# Patient Record
Sex: Female | Born: 1955 | Race: White | Hispanic: No | Marital: Married | Smoking: Former smoker
Health system: Southern US, Community
[De-identification: ages and names within clinical notes are randomized; demographics above are authoritative.]

## PROBLEM LIST (undated history)

## (undated) DIAGNOSIS — I319 Disease of pericardium, unspecified: Secondary | ICD-10-CM

## (undated) DIAGNOSIS — N828 Other female genital tract fistulae: Secondary | ICD-10-CM

## (undated) DIAGNOSIS — R112 Nausea with vomiting, unspecified: Secondary | ICD-10-CM

## (undated) DIAGNOSIS — K50113 Crohn's disease of large intestine with fistula: Secondary | ICD-10-CM

## (undated) DIAGNOSIS — K909 Intestinal malabsorption, unspecified: Secondary | ICD-10-CM

## (undated) DIAGNOSIS — Z9889 Other specified postprocedural states: Secondary | ICD-10-CM

## (undated) DIAGNOSIS — I739 Peripheral vascular disease, unspecified: Secondary | ICD-10-CM

## (undated) DIAGNOSIS — D509 Iron deficiency anemia, unspecified: Secondary | ICD-10-CM

## (undated) DIAGNOSIS — D649 Anemia, unspecified: Secondary | ICD-10-CM

## (undated) DIAGNOSIS — D510 Vitamin B12 deficiency anemia due to intrinsic factor deficiency: Secondary | ICD-10-CM

## (undated) DIAGNOSIS — Z5189 Encounter for other specified aftercare: Secondary | ICD-10-CM

## (undated) DIAGNOSIS — K5090 Crohn's disease, unspecified, without complications: Secondary | ICD-10-CM

## (undated) HISTORY — PX: ABDOMINAL HYSTERECTOMY: SHX81

## (undated) HISTORY — DX: Encounter for other specified aftercare: Z51.89

## (undated) HISTORY — PX: INCISION AND DRAINAGE PERIRECTAL ABSCESS: SHX1804

## (undated) HISTORY — DX: Iron deficiency anemia, unspecified: D50.9

## (undated) HISTORY — DX: Crohn's disease of large intestine with fistula: K50.113

## (undated) HISTORY — DX: Vitamin B12 deficiency anemia due to intrinsic factor deficiency: D51.0

## (undated) HISTORY — PX: BOWEL RESECTION: SHX1257

## (undated) HISTORY — DX: Intestinal malabsorption, unspecified: K90.9

## (undated) HISTORY — PX: APPENDECTOMY: SHX54

## (undated) HISTORY — PX: BREAST SURGERY: SHX581

---

## 1978-08-03 DIAGNOSIS — I739 Peripheral vascular disease, unspecified: Secondary | ICD-10-CM

## 1978-08-03 HISTORY — DX: Peripheral vascular disease, unspecified: I73.9

## 1998-11-01 ENCOUNTER — Other Ambulatory Visit: Admission: RE | Admit: 1998-11-01 | Discharge: 1998-11-01 | Payer: Self-pay | Admitting: Obstetrics and Gynecology

## 2000-01-16 ENCOUNTER — Ambulatory Visit (HOSPITAL_COMMUNITY): Admission: RE | Admit: 2000-01-16 | Discharge: 2000-01-16 | Payer: Self-pay | Admitting: Surgery

## 2000-04-20 ENCOUNTER — Other Ambulatory Visit: Admission: RE | Admit: 2000-04-20 | Discharge: 2000-04-20 | Payer: Self-pay | Admitting: Obstetrics and Gynecology

## 2001-05-02 ENCOUNTER — Other Ambulatory Visit: Admission: RE | Admit: 2001-05-02 | Discharge: 2001-05-02 | Payer: Self-pay | Admitting: Obstetrics and Gynecology

## 2002-05-08 ENCOUNTER — Other Ambulatory Visit: Admission: RE | Admit: 2002-05-08 | Discharge: 2002-05-08 | Payer: Self-pay | Admitting: Obstetrics and Gynecology

## 2004-01-15 ENCOUNTER — Other Ambulatory Visit: Admission: RE | Admit: 2004-01-15 | Discharge: 2004-01-15 | Payer: Self-pay | Admitting: Obstetrics and Gynecology

## 2004-11-08 ENCOUNTER — Inpatient Hospital Stay (HOSPITAL_COMMUNITY): Admission: EM | Admit: 2004-11-08 | Discharge: 2004-11-13 | Payer: Self-pay | Admitting: Emergency Medicine

## 2004-11-08 IMAGING — CR DG ABDOMEN ACUTE W/ 1V CHEST
3 series · 3 of 3 positions shown · non-contrast
Comparison: none

CLINICAL DATA: Abdominal pain.  Crohn?s disease.  
ABDOMINAL SERIES INCLUDING PA CHEST:
No comparison. 
No evidence of infiltrate or congestive heart failure.   Mediastinum and cardiac silhouette within normal limits.   bnormal bowel gas pattern with gas filled dilated loops of small bowel spanning over 4.2 cm.   No free intraperitoneal air.  If underlying inflammatory process related to Crohn?s disease is of a clinical concern, and further delineation is clinically desired, CT imaging may be considered.   There is a 1.4 cm radiopaque structure overlying the right lower quadrant abdomen.   A appendicolith cannot be excluded.  This also can be further delineated with CT imaging.

[w chest pa]
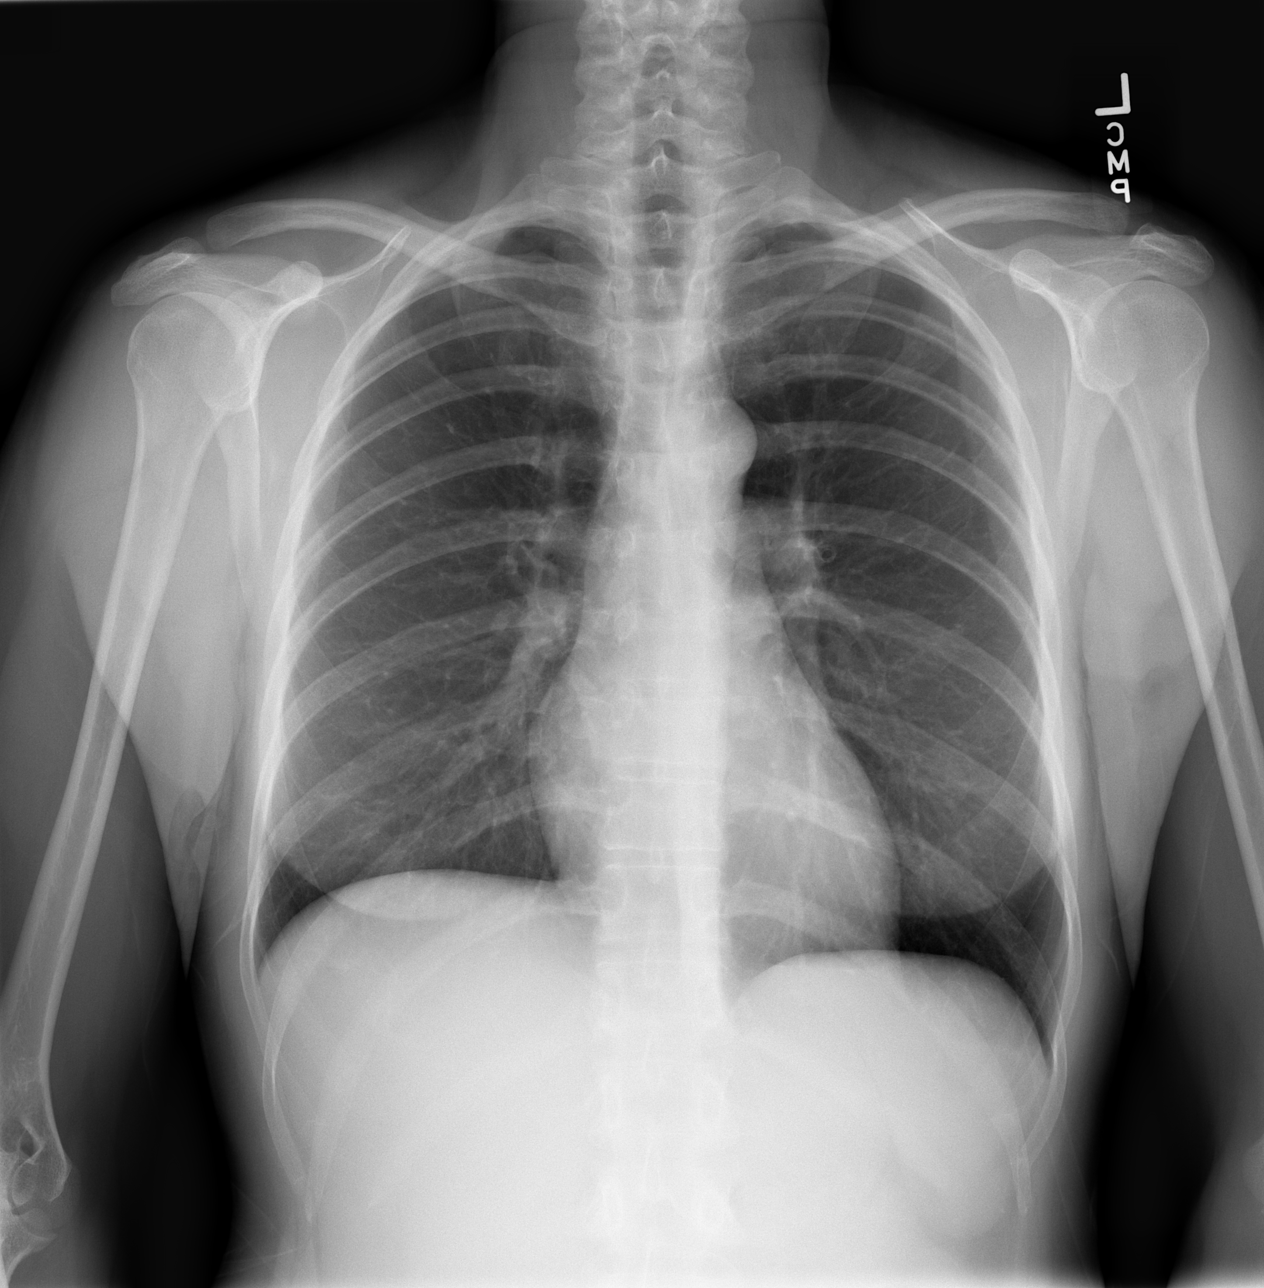

[w abdomen upright *]
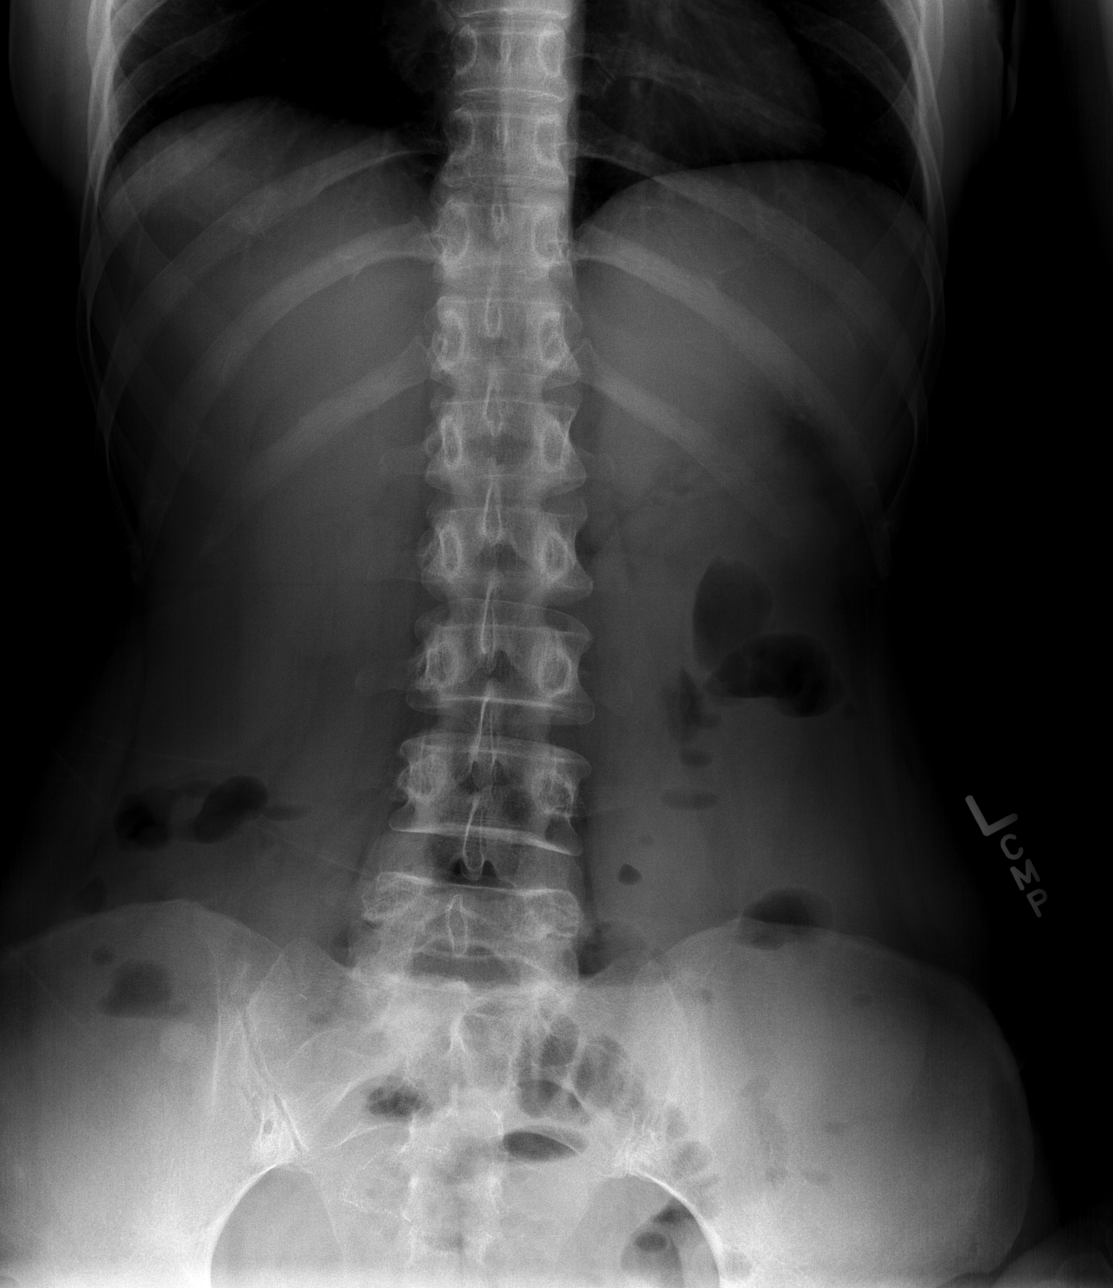

[t abdomen supine]
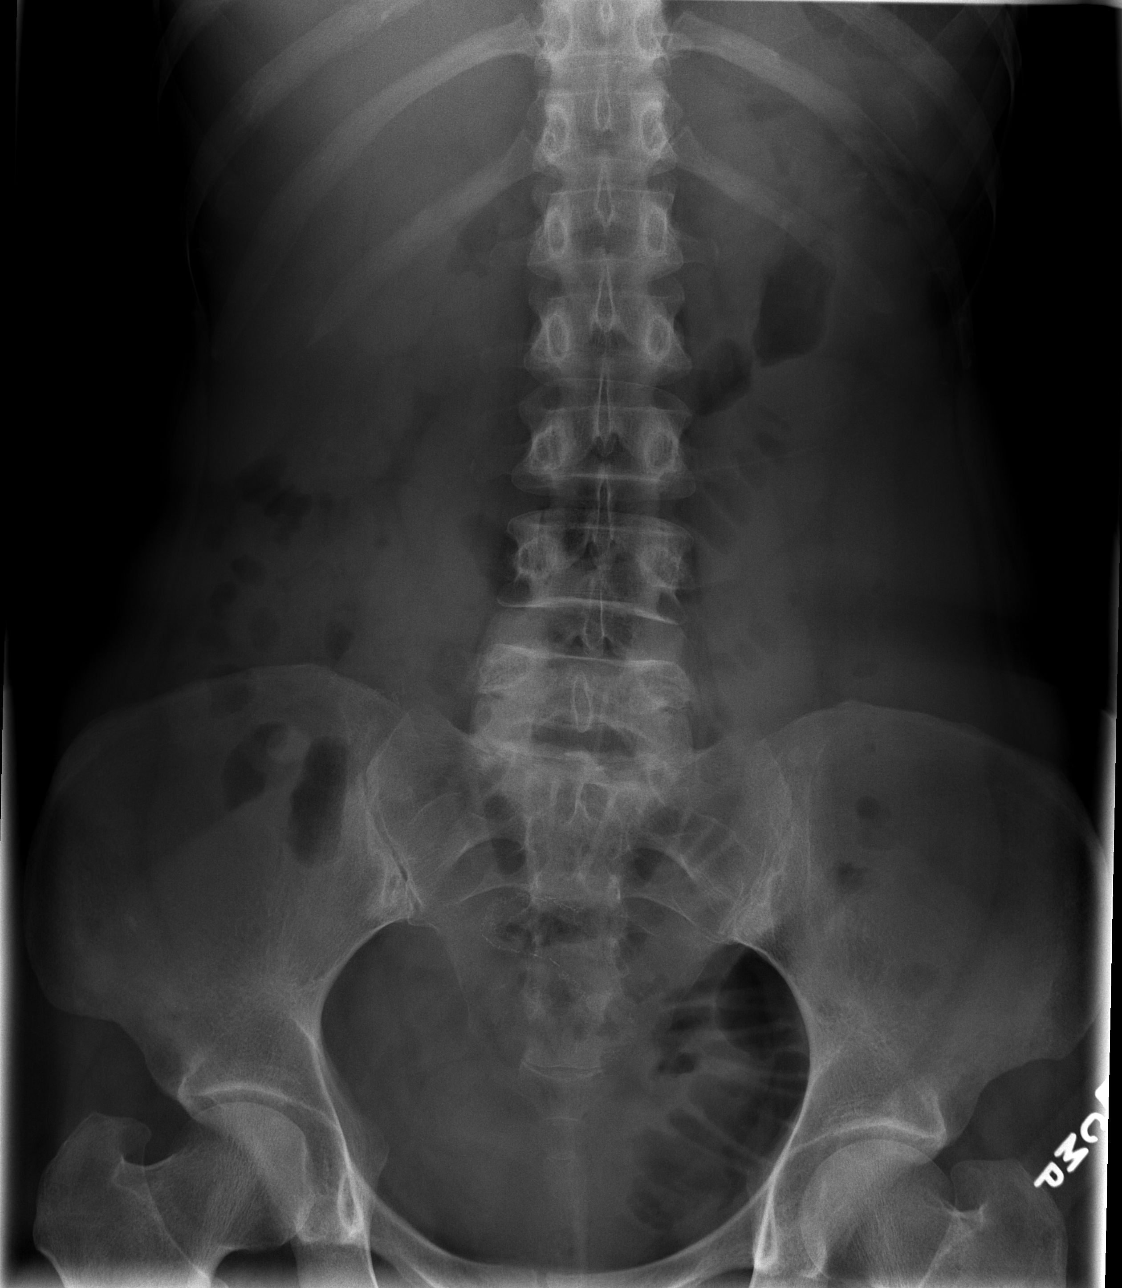

[3 of 3 positions shown; findings below may reference images not displayed]

IMPRESSION: Abnormal bowel gas pattern with dilated small bowel loops and possible appendicolith.  Please see above.

## 2004-11-12 ENCOUNTER — Encounter (INDEPENDENT_AMBULATORY_CARE_PROVIDER_SITE_OTHER): Payer: Self-pay | Admitting: Specialist

## 2004-11-13 ENCOUNTER — Encounter (INDEPENDENT_AMBULATORY_CARE_PROVIDER_SITE_OTHER): Payer: Self-pay | Admitting: Specialist

## 2005-01-28 ENCOUNTER — Other Ambulatory Visit: Admission: RE | Admit: 2005-01-28 | Discharge: 2005-01-28 | Payer: Self-pay | Admitting: Obstetrics and Gynecology

## 2005-04-20 ENCOUNTER — Encounter (INDEPENDENT_AMBULATORY_CARE_PROVIDER_SITE_OTHER): Payer: Self-pay | Admitting: Specialist

## 2005-04-20 ENCOUNTER — Inpatient Hospital Stay (HOSPITAL_COMMUNITY): Admission: RE | Admit: 2005-04-20 | Discharge: 2005-04-22 | Payer: Self-pay | Admitting: Obstetrics and Gynecology

## 2005-04-29 ENCOUNTER — Inpatient Hospital Stay (HOSPITAL_COMMUNITY): Admission: EM | Admit: 2005-04-29 | Discharge: 2005-05-06 | Payer: Self-pay | Admitting: Gastroenterology

## 2005-04-29 ENCOUNTER — Encounter (INDEPENDENT_AMBULATORY_CARE_PROVIDER_SITE_OTHER): Payer: Self-pay | Admitting: Specialist

## 2005-04-29 ENCOUNTER — Encounter: Payer: Self-pay | Admitting: Emergency Medicine

## 2005-05-27 ENCOUNTER — Encounter (HOSPITAL_COMMUNITY): Admission: RE | Admit: 2005-05-27 | Discharge: 2005-06-02 | Payer: Self-pay | Admitting: Gastroenterology

## 2005-10-20 ENCOUNTER — Observation Stay (HOSPITAL_COMMUNITY): Admission: AD | Admit: 2005-10-20 | Discharge: 2005-10-21 | Payer: Self-pay | Admitting: Gastroenterology

## 2005-11-09 ENCOUNTER — Ambulatory Visit (HOSPITAL_COMMUNITY): Admission: RE | Admit: 2005-11-09 | Discharge: 2005-11-10 | Payer: Self-pay | Admitting: Surgery

## 2005-11-27 ENCOUNTER — Encounter: Admission: RE | Admit: 2005-11-27 | Discharge: 2005-11-27 | Payer: Self-pay | Admitting: Gastroenterology

## 2006-01-14 ENCOUNTER — Encounter (HOSPITAL_COMMUNITY): Admission: RE | Admit: 2006-01-14 | Discharge: 2006-04-21 | Payer: Self-pay | Admitting: Gastroenterology

## 2007-11-08 ENCOUNTER — Encounter: Admission: RE | Admit: 2007-11-08 | Discharge: 2007-11-08 | Payer: Self-pay | Admitting: Obstetrics and Gynecology

## 2007-12-20 ENCOUNTER — Ambulatory Visit: Payer: Self-pay | Admitting: Hematology & Oncology

## 2008-01-18 LAB — CBC & DIFF AND RETIC
Basophils Absolute: 0 10*3/uL (ref 0.0–0.1)
Eosinophils Absolute: 0.2 10*3/uL (ref 0.0–0.5)
HGB: 10.1 g/dL — ABNORMAL LOW (ref 11.6–15.9)
IRF: 0.29 (ref 0.130–0.330)
NEUT#: 5.9 10*3/uL (ref 1.5–6.5)
RDW: 20 % — ABNORMAL HIGH (ref 11.3–14.5)
RETIC #: 42.1 10*3/uL (ref 19.7–115.1)
lymph#: 1.8 10*3/uL (ref 0.9–3.3)

## 2008-01-18 LAB — CHCC SMEAR

## 2008-01-20 LAB — TRANSFERRIN RECEPTOR, SOLUABLE: Transferrin Receptor, Soluble: 30.5 nmol/L

## 2008-02-07 ENCOUNTER — Ambulatory Visit: Payer: Self-pay | Admitting: Hematology & Oncology

## 2008-03-12 ENCOUNTER — Ambulatory Visit: Payer: Self-pay | Admitting: Hematology & Oncology

## 2008-05-11 ENCOUNTER — Ambulatory Visit: Payer: Self-pay | Admitting: Hematology & Oncology

## 2008-05-14 LAB — CBC WITH DIFFERENTIAL (CANCER CENTER ONLY)
BASO#: 0.1 10*3/uL (ref 0.0–0.2)
Eosinophils Absolute: 0.3 10*3/uL (ref 0.0–0.5)
HCT: 38.3 % (ref 34.8–46.6)
HGB: 12.8 g/dL (ref 11.6–15.9)
LYMPH#: 2.7 10*3/uL (ref 0.9–3.3)
MONO#: 0.3 10*3/uL (ref 0.1–0.9)
NEUT#: 7 10*3/uL — ABNORMAL HIGH (ref 1.5–6.5)
NEUT%: 67.3 % (ref 39.6–80.0)
RBC: 4.55 10*6/uL (ref 3.70–5.32)

## 2008-05-14 LAB — MORPHOLOGY - CHCC SATELLITE: PLT EST ~~LOC~~: ADEQUATE

## 2008-05-16 LAB — FERRITIN: Ferritin: 133 ng/mL (ref 10–291)

## 2008-05-16 LAB — TRANSFERRIN RECEPTOR, SOLUABLE: Transferrin Receptor, Soluble: 13.8 nmol/L

## 2008-05-16 LAB — RETICULOCYTES (CHCC)
ABS Retic: 31.9 10*3/uL (ref 19.0–186.0)
RBC.: 4.55 MIL/uL (ref 3.87–5.11)

## 2008-09-13 ENCOUNTER — Ambulatory Visit: Payer: Self-pay | Admitting: Hematology & Oncology

## 2008-09-14 LAB — CBC WITH DIFFERENTIAL (CANCER CENTER ONLY)
BASO%: 0.1 % (ref 0.0–2.0)
EOS%: 2.4 % (ref 0.0–7.0)
HCT: 34.2 % — ABNORMAL LOW (ref 34.8–46.6)
HGB: 11 g/dL — ABNORMAL LOW (ref 11.6–15.9)
LYMPH#: 1.7 10*3/uL (ref 0.9–3.3)
MCHC: 32.1 g/dL (ref 32.0–36.0)
MONO#: 0.8 10*3/uL (ref 0.1–0.9)
NEUT#: 5.3 10*3/uL (ref 1.5–6.5)
RDW: 17 % — ABNORMAL HIGH (ref 10.5–14.6)
WBC: 8 10*3/uL (ref 3.9–10.0)

## 2008-09-14 LAB — CHCC SATELLITE - SMEAR

## 2008-09-14 LAB — RETICULOCYTES (CHCC)
ABS Retic: 74.2 10*3/uL (ref 19.0–186.0)
RBC.: 4.12 MIL/uL (ref 3.87–5.11)
Retic Ct Pct: 1.8 % (ref 0.4–3.1)

## 2008-09-14 LAB — FERRITIN: Ferritin: 248 ng/mL (ref 10–291)

## 2008-12-20 ENCOUNTER — Ambulatory Visit: Payer: Self-pay | Admitting: Hematology & Oncology

## 2008-12-21 LAB — CBC WITH DIFFERENTIAL (CANCER CENTER ONLY)
BASO%: 0.5 % (ref 0.0–2.0)
EOS%: 4 % (ref 0.0–7.0)
HCT: 34 % — ABNORMAL LOW (ref 34.8–46.6)
LYMPH#: 2 10*3/uL (ref 0.9–3.3)
LYMPH%: 28.6 % (ref 14.0–48.0)
MCHC: 31.8 g/dL — ABNORMAL LOW (ref 32.0–36.0)
MONO#: 0.4 10*3/uL (ref 0.1–0.9)
NEUT%: 61.3 % (ref 39.6–80.0)
Platelets: 385 10*3/uL (ref 145–400)
RDW: 14.9 % — ABNORMAL HIGH (ref 10.5–14.6)
WBC: 7 10*3/uL (ref 3.9–10.0)

## 2008-12-21 LAB — CHCC SATELLITE - SMEAR

## 2008-12-24 LAB — VITAMIN D 25 HYDROXY (VIT D DEFICIENCY, FRACTURES): Vit D, 25-Hydroxy: 25 ng/mL — ABNORMAL LOW (ref 30–89)

## 2008-12-24 LAB — COMPREHENSIVE METABOLIC PANEL WITH GFR
ALT: 10 U/L (ref 0–35)
AST: 12 U/L (ref 0–37)
Albumin: 3.5 g/dL (ref 3.5–5.2)
Alkaline Phosphatase: 99 U/L (ref 39–117)
BUN: 8 mg/dL (ref 6–23)
CO2: 25 meq/L (ref 19–32)
Calcium: 9.3 mg/dL (ref 8.4–10.5)
Chloride: 107 meq/L (ref 96–112)
Creatinine, Ser: 0.73 mg/dL (ref 0.40–1.20)
Glucose, Bld: 67 mg/dL — ABNORMAL LOW (ref 70–99)
Potassium: 4.4 meq/L (ref 3.5–5.3)
Sodium: 142 meq/L (ref 135–145)
Total Bilirubin: 0.3 mg/dL (ref 0.3–1.2)
Total Protein: 7.2 g/dL (ref 6.0–8.3)

## 2008-12-24 LAB — RETICULOCYTES (CHCC)
ABS Retic: 45 K/uL (ref 19.0–186.0)
RBC.: 4.09 MIL/uL (ref 3.87–5.11)
Retic Ct Pct: 1.1 % (ref 0.4–3.1)

## 2008-12-24 LAB — FERRITIN: Ferritin: 122 ng/mL (ref 10–291)

## 2008-12-24 LAB — TRANSFERRIN RECEPTOR, SOLUABLE: Transferrin Receptor, Soluble: 17.4 nmol/L

## 2009-03-21 ENCOUNTER — Ambulatory Visit: Payer: Self-pay | Admitting: Hematology & Oncology

## 2009-03-22 LAB — CBC WITH DIFFERENTIAL (CANCER CENTER ONLY)
BASO%: 0.6 % (ref 0.0–2.0)
EOS%: 3.1 % (ref 0.0–7.0)
HCT: 34.4 % — ABNORMAL LOW (ref 34.8–46.6)
LYMPH%: 27.1 % (ref 14.0–48.0)
MCH: 27.8 pg (ref 26.0–34.0)
MCHC: 33.4 g/dL (ref 32.0–36.0)
MCV: 83 fL (ref 81–101)
MONO%: 5.2 % (ref 0.0–13.0)
NEUT%: 64 % (ref 39.6–80.0)
Platelets: 297 10*3/uL (ref 145–400)
RDW: 13.8 % (ref 10.5–14.6)
WBC: 7.6 10*3/uL (ref 3.9–10.0)

## 2009-03-22 LAB — CHCC SATELLITE - SMEAR

## 2009-03-24 LAB — COMPREHENSIVE METABOLIC PANEL
ALT: 12 U/L (ref 0–35)
Alkaline Phosphatase: 98 U/L (ref 39–117)
Creatinine, Ser: 0.71 mg/dL (ref 0.40–1.20)
Sodium: 140 mEq/L (ref 135–145)
Total Bilirubin: 0.2 mg/dL — ABNORMAL LOW (ref 0.3–1.2)
Total Protein: 6.9 g/dL (ref 6.0–8.3)

## 2009-05-16 ENCOUNTER — Ambulatory Visit: Payer: Self-pay | Admitting: Hematology & Oncology

## 2009-05-17 LAB — CBC WITH DIFFERENTIAL (CANCER CENTER ONLY)
BASO#: 0 10*3/uL (ref 0.0–0.2)
Eosinophils Absolute: 0.2 10*3/uL (ref 0.0–0.5)
HCT: 36.8 % (ref 34.8–46.6)
LYMPH%: 31 % (ref 14.0–48.0)
MCH: 28.4 pg (ref 26.0–34.0)
MCV: 86 fL (ref 81–101)
MONO#: 0.3 10*3/uL (ref 0.1–0.9)
MONO%: 4.9 % (ref 0.0–13.0)
NEUT%: 60.3 % (ref 39.6–80.0)
Platelets: 290 10*3/uL (ref 145–400)
RBC: 4.25 10*6/uL (ref 3.70–5.32)

## 2009-07-11 ENCOUNTER — Ambulatory Visit: Payer: Self-pay | Admitting: Hematology & Oncology

## 2009-07-12 LAB — CHCC SATELLITE - SMEAR

## 2009-07-12 LAB — CBC WITH DIFFERENTIAL (CANCER CENTER ONLY)
BASO#: 0.1 10*3/uL (ref 0.0–0.2)
Eosinophils Absolute: 0.2 10*3/uL (ref 0.0–0.5)
HGB: 12.3 g/dL (ref 11.6–15.9)
MCH: 28.9 pg (ref 26.0–34.0)
MCV: 85 fL (ref 81–101)
MONO#: 0.5 10*3/uL (ref 0.1–0.9)
MONO%: 5.6 % (ref 0.0–13.0)
NEUT#: 5.1 10*3/uL (ref 1.5–6.5)
RBC: 4.26 10*6/uL (ref 3.70–5.32)
WBC: 8 10*3/uL (ref 3.9–10.0)

## 2009-07-13 LAB — VITAMIN D 25 HYDROXY (VIT D DEFICIENCY, FRACTURES): Vit D, 25-Hydroxy: 20 ng/mL — ABNORMAL LOW (ref 30–89)

## 2009-07-13 LAB — FERRITIN: Ferritin: 143 ng/mL (ref 10–291)

## 2009-10-17 ENCOUNTER — Ambulatory Visit: Payer: Self-pay | Admitting: Hematology & Oncology

## 2009-10-18 LAB — COMPREHENSIVE METABOLIC PANEL
ALT: 16 U/L (ref 0–35)
AST: 12 U/L (ref 0–37)
Albumin: 3.5 g/dL (ref 3.5–5.2)
Alkaline Phosphatase: 96 U/L (ref 39–117)
BUN: 13 mg/dL (ref 6–23)
CO2: 26 mEq/L (ref 19–32)
Calcium: 8.4 mg/dL (ref 8.4–10.5)
Chloride: 106 mEq/L (ref 96–112)
Creatinine, Ser: 0.75 mg/dL (ref 0.40–1.20)
Glucose, Bld: 74 mg/dL (ref 70–99)
Potassium: 4.4 mEq/L (ref 3.5–5.3)
Sodium: 140 mEq/L (ref 135–145)
Total Bilirubin: 0.3 mg/dL (ref 0.3–1.2)
Total Protein: 7.3 g/dL (ref 6.0–8.3)

## 2009-10-18 LAB — CBC WITH DIFFERENTIAL (CANCER CENTER ONLY)
BASO#: 0 10*3/uL (ref 0.0–0.2)
BASO%: 0.4 % (ref 0.0–2.0)
EOS%: 2.9 % (ref 0.0–7.0)
Eosinophils Absolute: 0.2 10*3/uL (ref 0.0–0.5)
HCT: 34.6 % — ABNORMAL LOW (ref 34.8–46.6)
HGB: 11.3 g/dL — ABNORMAL LOW (ref 11.6–15.9)
LYMPH#: 2 10*3/uL (ref 0.9–3.3)
LYMPH%: 28.3 % (ref 14.0–48.0)
MCH: 28.4 pg (ref 26.0–34.0)
MCHC: 32.7 g/dL (ref 32.0–36.0)
MCV: 87 fL (ref 81–101)
MONO#: 0.4 10*3/uL (ref 0.1–0.9)
MONO%: 5.2 % (ref 0.0–13.0)
NEUT#: 4.5 10*3/uL (ref 1.5–6.5)
NEUT%: 63.2 % (ref 39.6–80.0)
Platelets: 289 10*3/uL (ref 145–400)
RBC: 3.98 10*6/uL (ref 3.70–5.32)
RDW: 12.4 % (ref 10.5–14.6)
WBC: 7.1 10*3/uL (ref 3.9–10.0)

## 2009-10-18 LAB — VITAMIN D 25 HYDROXY (VIT D DEFICIENCY, FRACTURES): Vit D, 25-Hydroxy: 19 ng/mL — ABNORMAL LOW (ref 30–89)

## 2009-10-18 LAB — FERRITIN: Ferritin: 116 ng/mL (ref 10–291)

## 2010-01-16 ENCOUNTER — Ambulatory Visit: Payer: Self-pay | Admitting: Hematology & Oncology

## 2010-01-17 LAB — CBC WITH DIFFERENTIAL (CANCER CENTER ONLY)
BASO#: 0.1 10*3/uL (ref 0.0–0.2)
BASO%: 1 % (ref 0.0–2.0)
EOS%: 4.4 % (ref 0.0–7.0)
Eosinophils Absolute: 0.3 10*3/uL (ref 0.0–0.5)
HCT: 35.8 % (ref 34.8–46.6)
HGB: 11.8 g/dL (ref 11.6–15.9)
LYMPH#: 2.2 10*3/uL (ref 0.9–3.3)
LYMPH%: 29.6 % (ref 14.0–48.0)
MCH: 28.3 pg (ref 26.0–34.0)
MCHC: 32.9 g/dL (ref 32.0–36.0)
MCV: 86 fL (ref 81–101)
MONO#: 0.3 10*3/uL (ref 0.1–0.9)
MONO%: 3.8 % (ref 0.0–13.0)
NEUT#: 4.5 10*3/uL (ref 1.5–6.5)
NEUT%: 61.2 % (ref 39.6–80.0)
Platelets: 259 10*3/uL (ref 145–400)
RBC: 4.15 10*6/uL (ref 3.70–5.32)
RDW: 12.5 % (ref 10.5–14.6)
WBC: 7.4 10*3/uL (ref 3.9–10.0)

## 2010-01-17 LAB — CHCC SATELLITE - SMEAR

## 2010-01-21 LAB — IRON AND TIBC
%SAT: 13 % — ABNORMAL LOW (ref 20–55)
Iron: 40 ug/dL — ABNORMAL LOW (ref 42–145)
TIBC: 316 ug/dL (ref 250–470)
UIBC: 276 ug/dL

## 2010-01-21 LAB — FERRITIN: Ferritin: 98 ng/mL (ref 10–291)

## 2010-01-21 LAB — RETICULOCYTES (CHCC)
ABS Retic: 40.3 10*3/uL (ref 19.0–186.0)
RBC.: 4.03 MIL/uL (ref 3.87–5.11)
Retic Ct Pct: 1 % (ref 0.4–3.1)

## 2010-01-21 LAB — VITAMIN D 25 HYDROXY (VIT D DEFICIENCY, FRACTURES): Vit D, 25-Hydroxy: 25 ng/mL — ABNORMAL LOW (ref 30–89)

## 2010-01-21 LAB — TRANSFERRIN RECEPTOR, SOLUABLE: Transferrin Receptor, Soluble: 18.5 nmol/L

## 2010-05-07 ENCOUNTER — Ambulatory Visit: Payer: Self-pay | Admitting: Hematology & Oncology

## 2010-05-09 LAB — COMPREHENSIVE METABOLIC PANEL
ALT: 19 U/L (ref 0–35)
AST: 14 U/L (ref 0–37)
Albumin: 3.4 g/dL — ABNORMAL LOW (ref 3.5–5.2)
Alkaline Phosphatase: 92 U/L (ref 39–117)
BUN: 11 mg/dL (ref 6–23)
CO2: 23 mEq/L (ref 19–32)
Calcium: 8.8 mg/dL (ref 8.4–10.5)
Creatinine, Ser: 0.74 mg/dL (ref 0.40–1.20)
Glucose, Bld: 99 mg/dL (ref 70–99)
Potassium: 4.3 mEq/L (ref 3.5–5.3)
Sodium: 139 mEq/L (ref 135–145)
Total Bilirubin: 0.3 mg/dL (ref 0.3–1.2)
Total Protein: 6.5 g/dL (ref 6.0–8.3)

## 2010-05-09 LAB — CBC WITH DIFFERENTIAL (CANCER CENTER ONLY)
BASO#: 0 10*3/uL (ref 0.0–0.2)
BASO%: 0.5 % (ref 0.0–2.0)
EOS%: 4.1 % (ref 0.0–7.0)
Eosinophils Absolute: 0.3 10*3/uL (ref 0.0–0.5)
HCT: 32.1 % — ABNORMAL LOW (ref 34.8–46.6)
LYMPH#: 1.9 10*3/uL (ref 0.9–3.3)
LYMPH%: 29.2 % (ref 14.0–48.0)
MCH: 27.1 pg (ref 26.0–34.0)
MCHC: 32.1 g/dL (ref 32.0–36.0)
MCV: 84 fL (ref 81–101)
MONO%: 5.9 % (ref 0.0–13.0)
NEUT#: 4 10*3/uL (ref 1.5–6.5)
NEUT%: 60.3 % (ref 39.6–80.0)
Platelets: 353 10*3/uL (ref 145–400)
RBC: 3.8 10*6/uL (ref 3.70–5.32)
RDW: 12.9 % (ref 10.5–14.6)
WBC: 6.6 10*3/uL (ref 3.9–10.0)

## 2010-05-09 LAB — IRON AND TIBC
%SAT: 8 % — ABNORMAL LOW (ref 20–55)
Iron: 23 ug/dL — ABNORMAL LOW (ref 42–145)
TIBC: 298 ug/dL (ref 250–470)
UIBC: 275 ug/dL

## 2010-05-09 LAB — CHCC SATELLITE - SMEAR

## 2010-05-09 LAB — FERRITIN: Ferritin: 90 ng/mL (ref 10–291)

## 2010-05-09 LAB — VITAMIN B12: Vitamin B-12: 384 pg/mL (ref 211–911)

## 2010-05-09 LAB — VITAMIN D 25 HYDROXY (VIT D DEFICIENCY, FRACTURES): Vit D, 25-Hydroxy: 26 ng/mL — ABNORMAL LOW (ref 30–89)

## 2010-09-01 ENCOUNTER — Ambulatory Visit (HOSPITAL_BASED_OUTPATIENT_CLINIC_OR_DEPARTMENT_OTHER): Payer: 59 | Admitting: Hematology & Oncology

## 2010-09-05 ENCOUNTER — Encounter (HOSPITAL_BASED_OUTPATIENT_CLINIC_OR_DEPARTMENT_OTHER): Payer: 59 | Admitting: Hematology & Oncology

## 2010-09-05 DIAGNOSIS — K5090 Crohn's disease, unspecified, without complications: Secondary | ICD-10-CM

## 2010-09-05 DIAGNOSIS — D509 Iron deficiency anemia, unspecified: Secondary | ICD-10-CM

## 2010-09-05 DIAGNOSIS — D539 Nutritional anemia, unspecified: Secondary | ICD-10-CM

## 2010-09-05 LAB — CBC WITH DIFFERENTIAL (CANCER CENTER ONLY)
BASO%: 0.4 % (ref 0.0–2.0)
EOS%: 3.4 % (ref 0.0–7.0)
HCT: 37.8 % (ref 34.8–46.6)
LYMPH#: 2.3 10*3/uL (ref 0.9–3.3)
LYMPH%: 25.8 % (ref 14.0–48.0)
MCH: 29.2 pg (ref 26.0–34.0)
MCHC: 32.3 g/dL (ref 32.0–36.0)
MCV: 90 fL (ref 81–101)
MONO#: 0.5 10*3/uL (ref 0.1–0.9)
MONO%: 5.2 % (ref 0.0–13.0)
NEUT%: 65.2 % (ref 39.6–80.0)
Platelets: 309 10*3/uL (ref 145–400)
RBC: 4.18 10*6/uL (ref 3.70–5.32)
RDW: 12.2 % (ref 10.5–14.6)
WBC: 8.9 10*3/uL (ref 3.9–10.0)

## 2010-09-05 LAB — CHCC SATELLITE - SMEAR

## 2010-09-06 LAB — IRON AND TIBC
%SAT: 20 % (ref 20–55)
Iron: 58 ug/dL (ref 42–145)
TIBC: 294 ug/dL (ref 250–470)
UIBC: 236 ug/dL

## 2010-09-06 LAB — TRANSFERRIN RECEPTOR, SOLUABLE: Transferrin Receptor, Soluble: 9.6 nmol/L

## 2010-09-06 LAB — RETICULOCYTES (CHCC)
ABS Retic: 49.2 10*3/uL (ref 19.0–186.0)
RBC.: 4.1 MIL/uL (ref 3.87–5.11)

## 2010-09-06 LAB — FERRITIN: Ferritin: 253 ng/mL (ref 10–291)

## 2010-12-12 ENCOUNTER — Other Ambulatory Visit: Payer: Self-pay | Admitting: Hematology & Oncology

## 2010-12-12 ENCOUNTER — Encounter (HOSPITAL_BASED_OUTPATIENT_CLINIC_OR_DEPARTMENT_OTHER): Payer: 59 | Admitting: Hematology & Oncology

## 2010-12-12 DIAGNOSIS — D510 Vitamin B12 deficiency anemia due to intrinsic factor deficiency: Secondary | ICD-10-CM

## 2010-12-12 DIAGNOSIS — K5090 Crohn's disease, unspecified, without complications: Secondary | ICD-10-CM

## 2010-12-12 DIAGNOSIS — D509 Iron deficiency anemia, unspecified: Secondary | ICD-10-CM

## 2010-12-12 LAB — CBC WITH DIFFERENTIAL (CANCER CENTER ONLY)
BASO%: 0.2 % (ref 0.0–2.0)
EOS%: 3.4 % (ref 0.0–7.0)
LYMPH#: 2.3 10*3/uL (ref 0.9–3.3)
MCHC: 31.9 g/dL — ABNORMAL LOW (ref 32.0–36.0)
NEUT#: 5.5 10*3/uL (ref 1.5–6.5)
NEUT%: 63.4 % (ref 39.6–80.0)
Platelets: 273 10*3/uL (ref 145–400)
RDW: 13.6 % (ref 11.1–15.7)
WBC: 8.6 10*3/uL (ref 3.9–10.0)

## 2010-12-12 LAB — RETICULOCYTES (CHCC)
ABS Retic: 48.6 10*3/uL (ref 19.0–186.0)
Retic Ct Pct: 1.2 % (ref 0.4–3.1)

## 2010-12-12 LAB — CHCC SATELLITE - SMEAR

## 2010-12-19 NOTE — H&P (Signed)
NAME:  Denise Lawson, BOETTNER NO.:  [id number]   MEDICAL RECORD NO.:  [id number]          PATIENT TYPE:  INP   LOCATION:  3039                         FACILITY:  [facility]   PHYSICIAN:  Tory Emerald. Benson Norway, MD    DATE OF BIRTH:  03/09/1956   DATE OF ADMISSION:  10/20/2005  DATE OF DISCHARGE:  10/21/2005                                HISTORY & PHYSICAL   REASON FOR ADMISSION:  Nausea, vomiting, tooth pain, diarrhea, and  dehydration.   HISTORY OF PRESENT ILLNESS:  This is a 55 year old white female with a past  medical history of Crohn's disease with obstructions requiring surgical  revision, complaining of abdominal pain, diarrhea, and tooth pain.  The  patient's symptoms started several days ago and has been progressively  getting worse.  Initially, she called the office, and she was prescribed  budesonide, as the thought was she may be having an exacerbation of her  Crohn's disease, which is also affecting her oral cavity.  The patient  denied having any aphthous ulcers in her mouth; however, her diarrhea was  consistent with her Crohn's disease.  She denied any symptoms of obstruction  at that time.  Unfortunately, her symptoms continued to persist in regards  to the tooth pain, and she was unable to tolerate any significant p.o. and  felt dehydrated.  The diarrhea had improved with the budesonide and has  become more formed.  The pain in the abdomen became more quiescent.  Unfortunately, because of the lack of po intake, she was subsequently  admitted to the hospital for IV hydration.  The patient denies any  complaints of hematochezia, melena, or fever.   PAST MEDICAL HISTORY:  As stated above.   PAST SURGICAL HISTORY:  As stated above.   FAMILY HISTORY:  Noncontributory.   SOCIAL HISTORY:  Negative x3.   REVIEW OF SYSTEMS:  As stated above.  It is positive for headache.  Negative  for chest pain, shortness of breath, arthritis, arthralgias, rashes.   ALLERGIES:   DARVOCET.   MEDICATIONS:  1.  Budesonide.  2.  Lidocaine swish.   PHYSICAL EXAMINATION:  VITAL SIGNS:  Stable.  GENERAL:  Patient is in acute distress.  She appears to be weak.  HEENT:  Normocephalic and atraumatic.  Extraocular muscles are intact.  Pupils are equal, round and reactive to light.  Oropharynx is negative for  any abscesses.  NECK:  Supple.  No lymphadenopathy.  LUNGS:  Clear to auscultation bilaterally.  CARDIOVASCULAR:  Regular rate and rhythm.  ABDOMEN:  Flat, soft, nontender, nondistended.  EXTREMITIES:  No clubbing, cyanosis or edema.   LABORATORY VALUES:  Pending at this time.   IMPRESSION:  1.  Alveolar pain of unclear etiology.  2.  Crohn's disease.   It is apparent that the patient may be having a mild flare-up of her Crohn's  disease, as she is having more diarrhea; however, I am uncertain in regards  to the connection with her oral cavity.  Crohn's disease is a systemic  manifestation, but on this examination, I am unable to discern any aphthous  ulcers or other etiologies consistent with Crohn's disease.  She denies  having any ear infections or sinus congestion.  She does have an appointment  with a dentist on October 21, 2005 at 8:00 a.m., but I feel at this time,  because of her dehydration, she requires the IV hydration.   PLAN:  1.  Normal saline IV hydration at 150 cc/hr.  2.  Morphine 2-4 mg IV q.4-6h. p.r.n. pain.  3.  Ibuprofen 800 mg 1 p.o. t.i.d.  4.  Lidocaine swish.  5.  Keep dental appointment in the morning.  6.  Check CMET, CBC, and ESR.      Tory Emerald Benson Norway, MD  Electronically Signed     PDH/MEDQ  D:  10/21/2005  T:  10/21/2005  Job:  [id number]

## 2010-12-19 NOTE — Discharge Summary (Signed)
NAME:  Denise Lawson, Denise Lawson                ACCOUNT NO.:  [id number]   MEDICAL RECORD NO.:  [id number]          PATIENT TYPE:  INP   LOCATION:  5738                         FACILITY:  [facility]   PHYSICIAN:  Tory Emerald. Benson Norway, MD    DATE OF BIRTH:  [birth year]   DATE OF ADMISSION:  04/29/2005  DATE OF DISCHARGE:  05/06/2005                                 DISCHARGE SUMMARY   DISCHARGE DIAGNOSES:  1.  Perforated Crohn's ileal/colic stricture, status post right ileal/colic      resection with primary anastomosis by Dr. Hassell Done on April 29, 2005.  2.  Crohn's disease.  3.  Status post recent hysterectomy.  4.  History of laparotomy with small-bowel resection.   HOSPITAL COURSE:  Mr. Canton is a 55 year old female with known Crohn's  disease who has also undergone an ileocolonic anastomosis 10 years ago  secondary to a ruptured appendix.  She presented on April 29, 2005 with  abdominal pain, nausea, and vomiting.  A CT scan in the emergency room  revealed findings consistent an anastomotic stricture, and she underwent  emergent colonoscopy with balloon dilatation by Tory Emerald. Benson Norway, M.D.  After  the dilatation, there was no perforation, and general surgery was consulted.   The patient underwent a right ileal/colic resection with primary anastomosis  under the care of Matthew B. Hassell Done, M.D.  The patient tolerated the  procedure well.  She remained in the hospital until postoperative day #7  where she was felt to be ready for discharge to home.  She was kept on oral  antibiotics after her discharge. She had had some pleuritic chest pain  during her hospitalization, and a CT scan showed no pulmonary embolism.   She was discharged home in stable condition.   DISCHARGE MEDICATIONS:  1.  Vicodin as needed for pain.  2.  Augmentin twice a day for one week.   DISCHARGE INSTRUCTIONS:  She is not to drive for one week.  No lifting over  10 pounds for four weeks.   FOLLOW UP:  She has a follow-up  appointment to see Dr. Hassell Done on May 20, 2005 at 4:25 p.m.  She is to call for any fevers greater than 101.5 or any  redness or drainage of her abdominal site.      Joesphine Bare, P.A.      Tory Emerald Benson Norway, MD  Electronically Signed    LB/MEDQ  D:  07/01/2005  T:  07/02/2005  Job:  [id number]   cc:   Isabel Caprice Hassell Done, [place] [address] [place] [address]

## 2010-12-19 NOTE — Consult Note (Signed)
NAME:  Denise Lawson, Denise Lawson                ACCOUNT NO.:  [id number]   MEDICAL RECORD NO.:  [id number]          PATIENT TYPE:  INP   LOCATION:  5006                         FACILITY:  [facility]   PHYSICIAN:  Tory Emerald. Benson Norway, MD    DATE OF BIRTH:  June 21, 1956   DATE OF CONSULTATION:  11/11/2004  DATE OF DISCHARGE:                                   CONSULTATION   REASON FOR CONSULTATION:  Abnormal CT scan.   HISTORY OF PRESENT ILLNESS:  This is a 55 year old white female with a past  medical history significant for status post ileocolonic anastomosis for  chronic appendicitis that ruptured approximately 7-10 years prior and a  history of diarrhea.  The patient states that approximately 1-2 days prior  to admission, she experienced some cramping sensation of her abdomen which  is not uncommon for her to experience.  She states she went to her primary  care physician and received some Phenergan as well as another medication  which helped to alleviate her symptoms temporarily.  Unfortunately, the  symptoms returned after the medication was not effective anymore and she  subsequently presented to the emergency room  at the direction of her family  physician.   In the emergency room, a CT scan was performed after an abdominal series was  obtained revealing a high grade stricture in the distal small bowel which is  most likely an anastomotic stricture.  With these findings, the patient was  subsequently admitted to the surgical service and conservative management  was instituted.  An NG tube was placed and after three days of conservative  management, her symptoms improved.  At this time, the patient had removal of  her NG tube and she states that her cramping abdominal pain has not recurred  and she does have some liquid brown bowel movements.  Because of the  abnormality of the CT scan in addition to the high grade stricture, there  was a possible fecalith versus a foreign body that was noted just  proximal  to the stricture.  Hence, GI consultation is requested at this time to  further evaluate this abnormality with an endoscopic examination.   PAST MEDICAL AND SURGICAL HISTORY:  As stated above.   ALLERGIES:  Darvocet.   MEDICATIONS:  None.   REVIEW OF SYMPTOMS:  The patient denies any headache, blurred vision,  tinnitus.  Positive for nausea, vomiting, abdominal pain.  No complaints of  shortness of breath or chest pain.  Positive history of diarrhea.  No  arthralgias, arthritis, or myalgias.   FAMILY HISTORY:  Noncontributory.   SOCIAL HISTORY:  The patient is married and has a child.   PHYSICAL EXAMINATION:  VITAL SIGNS:  Blood pressure 103/67, heart rate 90, temperature 99.4,  respirations 20, pulse ox 96%.  GENERAL:  The patient is in no acute distress, alert and oriented.  HEENT:  Normocephalic, atraumatic, extraocular movements intact, pupils  equal, round, reactive to light.  NECK:  Supple, no lymphadenopathy.  LUNGS:  Clear to auscultation bilaterally.  HEART:  Regular rate and rhythm without murmurs, gallops, and rubs.  ABDOMEN:  Flat, mildly tympanitic, positive bowel sounds, there is some  tenderness in the periumbilical region.  No rebound.  EXTREMITIES:  No cyanosis, clubbing, and edema.   LABORATORY DATA:  On November 09, 2004, white blood cell count is 4.1,  hemoglobin 9.9, MCV 83.9, platelets 218.  Sodium 138, potassium 3.1,  chloride 110, CO2 21, glucose 130, BUN 17, creatinine 0.8, calcium 7.5.  Urinalysis positive for E. coli.  CT scan is as stated above in the history  of present illness.   IMPRESSION:  1.  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  2.  Chronic diarrhea.  3.  Anemia.   In regards to the patient's high grade stricture, it is most likely  secondary to scarring from the anastomosis.  It is not unreasonable to  perform colonoscopy to evaluate the stricture and possibly attempt  dilatation.  I have discussed  in detail with the patient in regards to the  possibility of performing a dilation carrying the risk of perforation in a  relatively unprepped colon.  She understands and acknowledges the risks and  wishes to proceed at this time.  Additionally, the patient is noted to have  chronic diarrhea.  It is uncertain why she has multiple bowel movements per  day for these many years, however, celiac disease is a consideration and at  the time of the colonoscopy, an EGD will be performed with small bowel  biopsy.  She does carry a possible history of Crohn's disease, however, no  records are available at this time for further evaluation.  But, biopsies  will be taken in the colon if there are any suspicious areas that are noted.  The patient is also noted to have an anemia and the source of anemia is  unknown at this time.  Further evaluation with iron studies will be  performed.  If it is iron deficiency anemia, this can be correlated with  possible celiac disease.   PLAN:  1.  Perform tap water enemas, 1-2 liters, 2-3 times now as tolerated by the      patient.  2.  A 1 liter tap water enema at 6:30 a.m. on November 12, 2004.  3.  On call for endoscopy on November 12, 2004.  4.  Check coag panel, iron panel, and ferritin.      PDH/MEDQ  D:  11/11/2004  T:  11/11/2004  Job:  [id number]   cc:   Isabel Caprice Hassell Done, [place] [address]  [place]  [place] [address]   Gwenyth Ober III, M.D.  [phone] [address]  [place]  [place] [address]   [facility]

## 2010-12-19 NOTE — Op Note (Signed)
NAME:  Denise Lawson, Denise Lawson                ACCOUNT NO.:  [id number]   MEDICAL RECORD NO.:  [id number]          PATIENT TYPE:  OIB   LOCATION:  [place]                         FACILITY:  [facility]   PHYSICIAN:  Joyice Faster. Cornett, M.D.DATE OF BIRTH:  July 06, 1956   DATE OF PROCEDURE:  11/09/2005  DATE OF DISCHARGE:  11/10/2005                                 OPERATIVE REPORT   PREOP DIAGNOSIS:  Perirectal abscess.   POSTOP DIAGNOSIS:  1.  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  2.  Posterior midline anal fissure.  3.  Anterior rectal fissure just above the dentate line.   PROCEDURE:  1.  Exam under anesthesia.  2.  Placement of seton for fistula in ano.  3.  Rigid proctoscopy.   SURGEON:  Erroll Luna, MD   ANESTHESIA:  LMA.   DRAINS:  None.   SPECIMENS:  None.   INDICATIONS FOR PROCEDURE:  The patient is a 55 year old female with a  longstanding history of Crohn disease.  She has had a 2-week history of  significant perianal pain which became significantly worse over the last 36  hours.  She was seen today in the urgent office by Dr. Hedy Camara and was sent to  [facility] for examination under anesthesia due to significant pain and  concern for a perirectal abscess due to her low grade fever and pain.  I had  just talked with the patient preoperatively about what we were going to do,  which included exam under anesthesia and drainage of abscess if present.  She understood, agreed to proceed.  Risks were discussed which include  fistula in ano, bleeding, infection, possibility of incontinence as well as  the need for more surgery.   DESCRIPTION OF PROCEDURE:  The patient brought to the operating room, placed  supine.  After induction of LMA anesthesia, she is placed in stirrups in the  lithotomy position.  Perianal region was prepped and draped in sterile  fashion.  Digital examination was done.  I advanced my finger to about 7 cm  and could not palpate any fluctuant mass or could not express any  pus.  Upon  examination of the anal canal.  There is a very large posterior anal fissure  and what appeared to be a fistula originating approximately 1 cm from the  anal verge in the posterior midline.  I was able to slide a probe up through  this and with help of a anoscope I was able to visualize the dentate line.  The fistula track wrapped around what appeared to be the internal anal  sphincter and came out just in the midline and the dentate region consistent  with a fistula in ano.  The sphincter was completely exposed at this point.  There was considerable inflammation this area which made the anatomy a  little difficult to discern.  The tract did not track any more superior and  I could not see any other areas of fluctuance or tracts.  Of note in the  anterior midline just above the dentate line was a rectal fissure.  I did a  examination  of her vaginal cuff region and could not palpate any abscess or  communication from this area in the anterior rectum just above the dentate  line to the vagina.  Again I did a complete digital examination of both the  vaginal cuff as well as the rectum and could not palpate any fluctuance.  A  rigid proctoscope was advanced to approximately 10 cm.  The mucosa looked  relatively good in the rectum with no signs of any other ulcerative type  lesions in the rectum or any signs of any draining areas.  I advanced slowly  using minimal insufflation for this.  At this point, given the amount of  inflammation and her history and low grade fever I felt a seton would be  best since this did traverse her sphincter apparatus and I could not be sure  exactly due to the amount of the inflammation in the posterior midline  incision if this involved the external sphincter as well.  I used a 2-0  Prolene stitch and passed it using a probe through the fistula tract quite  easily making sure to create no false passage.  I then cinched this down  around the fistula  tract.  I used two metal clips to hold the suture in  place.  I then cut off the excess string to complete the seton placement.  At this point, I did not see any other undrained collections and there was  no significant purulence when I did a digital examination and pressed.  At  this point, all instruments were counted found to be correct.  The patient  was awakened and taken to recovery in satisfactory condition after removing  her from lithotomy and removing the LMA.      Thomas A. Cornett, M.D.  Electronically Signed     TAC/MEDQ  D:  11/09/2005  T:  11/10/2005  Job:  [id number]   cc:   Tory Emerald. Benson Norway, MD  Fax: [phone]

## 2011-03-18 ENCOUNTER — Encounter (HOSPITAL_BASED_OUTPATIENT_CLINIC_OR_DEPARTMENT_OTHER): Payer: 59 | Admitting: Hematology & Oncology

## 2011-03-18 ENCOUNTER — Other Ambulatory Visit: Payer: Self-pay | Admitting: Hematology & Oncology

## 2011-03-18 DIAGNOSIS — K5090 Crohn's disease, unspecified, without complications: Secondary | ICD-10-CM

## 2011-03-18 DIAGNOSIS — D539 Nutritional anemia, unspecified: Secondary | ICD-10-CM

## 2011-03-18 DIAGNOSIS — D518 Other vitamin B12 deficiency anemias: Secondary | ICD-10-CM

## 2011-03-18 DIAGNOSIS — F329 Major depressive disorder, single episode, unspecified: Secondary | ICD-10-CM

## 2011-03-18 DIAGNOSIS — D509 Iron deficiency anemia, unspecified: Secondary | ICD-10-CM

## 2011-03-18 LAB — CBC WITH DIFFERENTIAL (CANCER CENTER ONLY)
BASO#: 0 10*3/uL (ref 0.0–0.2)
EOS%: 2.3 % (ref 0.0–7.0)
HCT: 36.3 % (ref 34.8–46.6)
HGB: 11.5 g/dL — ABNORMAL LOW (ref 11.6–15.9)
LYMPH%: 23.5 % (ref 14.0–48.0)
MCH: 29.3 pg (ref 26.0–34.0)
MCHC: 31.7 g/dL — ABNORMAL LOW (ref 32.0–36.0)
MCV: 92 fL (ref 81–101)
MONO%: 4.9 % (ref 0.0–13.0)
NEUT%: 69.1 % (ref 39.6–80.0)

## 2011-03-19 LAB — FERRITIN: Ferritin: 240 ng/mL (ref 10–291)

## 2011-03-19 LAB — RETICULOCYTES (CHCC)
RBC.: 3.94 MIL/uL (ref 3.87–5.11)
Retic Ct Pct: 1.5 % (ref 0.4–2.3)

## 2011-03-19 LAB — TSH: TSH: 1.494 u[IU]/mL (ref 0.350–4.500)

## 2011-03-19 LAB — MAGNESIUM: Magnesium: 2 mg/dL (ref 1.5–2.5)

## 2011-03-27 ENCOUNTER — Encounter (HOSPITAL_BASED_OUTPATIENT_CLINIC_OR_DEPARTMENT_OTHER): Payer: 59 | Admitting: Hematology & Oncology

## 2011-03-27 DIAGNOSIS — K5090 Crohn's disease, unspecified, without complications: Secondary | ICD-10-CM

## 2011-03-27 DIAGNOSIS — D509 Iron deficiency anemia, unspecified: Secondary | ICD-10-CM

## 2011-11-26 ENCOUNTER — Telehealth: Payer: Self-pay | Admitting: Hematology & Oncology

## 2011-11-26 NOTE — Telephone Encounter (Signed)
Pt called made 5-24 appointment, she needs fridays.

## 2011-12-25 ENCOUNTER — Other Ambulatory Visit (HOSPITAL_BASED_OUTPATIENT_CLINIC_OR_DEPARTMENT_OTHER): Payer: 59 | Admitting: Lab

## 2011-12-25 ENCOUNTER — Ambulatory Visit (HOSPITAL_BASED_OUTPATIENT_CLINIC_OR_DEPARTMENT_OTHER): Payer: 59 | Admitting: Hematology & Oncology

## 2011-12-25 VITALS — BP 106/61 | HR 75 | Temp 97.8°F | Ht 62.0 in | Wt 111.0 lb

## 2011-12-25 DIAGNOSIS — D510 Vitamin B12 deficiency anemia due to intrinsic factor deficiency: Secondary | ICD-10-CM

## 2011-12-25 DIAGNOSIS — D509 Iron deficiency anemia, unspecified: Secondary | ICD-10-CM

## 2011-12-25 DIAGNOSIS — K5090 Crohn's disease, unspecified, without complications: Secondary | ICD-10-CM

## 2011-12-25 LAB — CBC WITH DIFFERENTIAL (CANCER CENTER ONLY)
BASO%: 0.2 % (ref 0.0–2.0)
EOS%: 3.4 % (ref 0.0–7.0)
MCH: 29.4 pg (ref 26.0–34.0)
MCHC: 31.6 g/dL — ABNORMAL LOW (ref 32.0–36.0)
MONO%: 5.9 % (ref 0.0–13.0)
NEUT#: 6.2 10*3/uL (ref 1.5–6.5)
Platelets: 295 10*3/uL (ref 145–400)
RBC: 4.02 10*6/uL (ref 3.70–5.32)

## 2011-12-25 LAB — CHCC SATELLITE - SMEAR

## 2011-12-25 NOTE — Progress Notes (Signed)
CC:   Denise Lawson, M.D. Denise Emerald Benson Norway, Denise Lawson  DIAGNOSES: 1. Iron-deficiency anemia. 2. Pernicious anemia. 3. Crohn disease.  CURRENT THERAPY: 1. IV iron as indicated. 2. The patient receives vitamin B12 one milligram IM q.2 weeks     (patient does at home).  INTERIM HISTORY:  Denise Lawson comes in for followup.  She is doing okay, although she does feel quite tired.  I have not seen her since August. I am probably quite sure that she is iron-deficient again.  Last time we checked her ferritin was back in August and she had a ferritin of 240 but iron saturation of 15%.  She is not having issues with change in diarrhea.  She does watch her diet.  She has had no problems with rashes.  There has been no bleeding.  She is looking into human growth hormone as a treatment for her Crohn's. She sees Dr. Rosaleigh Ada.  I will let him determine if this is an appropriate off-label use for [facility].  PHYSICAL EXAMINATION:  This is a thin but fairly well-nourished white female in no obvious distress.  Vital signs:  Temperature of 97.8, pulse 75, respiratory rate 20, blood pressure 106/61.  Weight is 111.  Head and neck:  A normocephalic, atraumatic skull.  There are no ocular or oral lesions.  There are no palpable cervical or supraclavicular lymph nodes.  Lungs:  Clear bilaterally.  Cardiac:  Regular rate and rhythm with a normal S1 and S2.  There are no murmurs, rubs or bruits. Abdomen:  Soft with good bowel sounds.  There is no palpable abdominal mass.  There is no fluid wave.  There is no palpable hepatosplenomegaly. She has a laparotomy scar that is well-healed.  Back:  No tenderness of the spine, ribs, or hips.  Extremities:  No clubbing, cyanosis or edema. Skin:  Some macular-type rash on her left lower leg.  Neurologic:  No focal neurological deficits.  LABORATORY STUDIES:  White cell count is 9.8, hemoglobin 11.8, hematocrit 37.4, platelet count 295.  MCV is 93.  IMPRESSION:  Ms.  Lawson is a 56 year old white female with Crohn disease.  She has iron-deficiency and pernicious anemia.  She does her B12 shots herself.  Again, we will have to see what her iron studies show.  I have to believe that she is going to be iron-deficient.  She usually does very well with IV iron.  We will plan to likely give her iron in a couple of weeks or so.  I want see her back in about 3 months' time.  When we see her back, then we will recheck her iron studies and also check her B12.    ______________________________ Volanda Napoleon, M.D. PRE/MEDQ  D:  12/25/2011  T:  12/25/2011  Job:  [id number]

## 2011-12-25 NOTE — Progress Notes (Signed)
This office note has been dictated.

## 2011-12-25 NOTE — Progress Notes (Signed)
Addended by: Burney Gauze R on: 12/25/2011 05:53 PM   Modules accepted: Orders

## 2011-12-26 LAB — IRON AND TIBC: %SAT: 13 % — ABNORMAL LOW (ref 20–55)

## 2011-12-26 LAB — RETICULOCYTES (CHCC): Retic Ct Pct: 1.2 % (ref 0.4–2.3)

## 2011-12-26 LAB — VITAMIN D 25 HYDROXY (VIT D DEFICIENCY, FRACTURES): Vit D, 25-Hydroxy: 24 ng/mL — ABNORMAL LOW (ref 30–89)

## 2011-12-30 ENCOUNTER — Telehealth: Payer: Self-pay | Admitting: *Deleted

## 2011-12-30 ENCOUNTER — Other Ambulatory Visit: Payer: Self-pay | Admitting: *Deleted

## 2011-12-30 DIAGNOSIS — E611 Iron deficiency: Secondary | ICD-10-CM

## 2011-12-30 NOTE — Telephone Encounter (Signed)
Called patient to let her know that her iron levels were low per dr. Marin Olp.  Patient would like to come Friday 01/01/12 at 1215 for infusion.

## 2011-12-30 NOTE — Telephone Encounter (Signed)
Message copied by Rico Ala on Wed Dec 30, 2011 10:14 AM ------      Message from: Volanda Napoleon      Created: Tue Dec 29, 2011  2:53 PM       Call- iron is low.  Needs Fereheme 1039m x 1 dose in 1 week.  Please set up!!!  pete

## 2011-12-31 ENCOUNTER — Other Ambulatory Visit: Payer: Self-pay | Admitting: *Deleted

## 2011-12-31 MED ORDER — ERGOCALCIFEROL 1.25 MG (50000 UT) PO CAPS: 50000.0000 [IU] | ORAL_CAPSULE | ORAL | Status: DC

## 2012-01-01 ENCOUNTER — Ambulatory Visit (HOSPITAL_BASED_OUTPATIENT_CLINIC_OR_DEPARTMENT_OTHER): Payer: 59

## 2012-01-01 VITALS — BP 104/63 | HR 80 | Temp 97.5°F

## 2012-01-01 DIAGNOSIS — D509 Iron deficiency anemia, unspecified: Secondary | ICD-10-CM

## 2012-01-01 DIAGNOSIS — E611 Iron deficiency: Secondary | ICD-10-CM

## 2012-01-01 MED ORDER — SODIUM CHLORIDE 0.9 % IV SOLN
1020.0000 mg | Freq: Once | INTRAVENOUS | Status: AC
  Administered 2012-01-01: 1020 mg via INTRAVENOUS
  Filled 2012-01-01: qty 34

## 2012-01-01 MED ORDER — SODIUM CHLORIDE 0.9 % IV SOLN
Freq: Once | INTRAVENOUS | Status: AC
  Administered 2012-01-01: 13:00:00 via INTRAVENOUS

## 2012-01-01 NOTE — Patient Instructions (Signed)
Ferumoxytol injection What is this medicine? FERUMOXYTOL is an iron complex. Iron is used to make healthy red blood cells, which carry oxygen and nutrients throughout the body. This medicine is used to treat iron deficiency anemia in people with chronic kidney disease. This medicine may be used for other purposes; ask your health care provider or pharmacist if you have questions. What should I tell my health care provider before I take this medicine? They need to know if you have any of these conditions: -anemia not caused by low iron levels -high levels of iron in the blood -magnetic resonance imaging (MRI) test scheduled -an unusual or allergic reaction to iron, other medicines, foods, dyes, or preservatives -pregnant or trying to get pregnant -breast-feeding How should I use this medicine? This medicine is for infusion into a vein. It is given by a health care professional in a hospital or clinic setting. Talk to your pediatrician regarding the use of this medicine in children. Special care may be needed. Overdosage: If you think you've taken too much of this medicine contact a poison control center or emergency room at once. Overdosage: If you think you have taken too much of this medicine contact a poison control center or emergency room at once. NOTE: This medicine is only for you. Do not share this medicine with others. What if I miss a dose? It is important not to miss your dose. Call your doctor or health care professional if you are unable to keep an appointment. What may interact with this medicine? This medicine may interact with the following medications: -other iron products This list may not describe all possible interactions. Give your health care provider a list of all the medicines, herbs, non-prescription drugs, or dietary supplements you use. Also tell them if you smoke, drink alcohol, or use illegal drugs. Some items may interact with your medicine. What should I watch  for while using this medicine? Visit your doctor or healthcare professional regularly. Tell your doctor or healthcare professional if your symptoms do not start to get better or if they get worse. You may need blood work done while you are taking this medicine. You may need to follow a special diet. Talk to your doctor. Foods that contain iron include: whole grains/cereals, dried fruits, beans, or peas, leafy green vegetables, and organ meats (liver, kidney). What side effects may I notice from receiving this medicine? Side effects that you should report to your doctor or health care professional as soon as possible: -allergic reactions like skin rash, itching or hives, swelling of the face, lips, or tongue -breathing problems -changes in blood pressure -feeling faint or lightheaded, falls -fever or chills -flushing, sweating, or hot feelings -swelling of the ankles or feet Side effects that usually do not require medical attention (Report these to your doctor or health care professional if they continue or are bothersome.): -diarrhea -headache -nausea, vomiting -stomach pain This list may not describe all possible side effects. Call your doctor for medical advice about side effects. You may report side effects to FDA at 1-800-FDA-1088. Where should I keep my medicine? This drug is given in a hospital or clinic and will not be stored at home. NOTE: This sheet is a summary. It may not cover all possible information. If you have questions about this medicine, talk to your doctor, pharmacist, or health care provider.  2012, Elsevier/Gold Standard. (04/11/2008 9:48:25 PM) 

## 2012-03-24 ENCOUNTER — Telehealth: Payer: Self-pay | Admitting: Hematology & Oncology

## 2012-03-24 NOTE — Telephone Encounter (Signed)
Pt moved 8-23 to 9-6

## 2012-03-25 ENCOUNTER — Ambulatory Visit: Payer: 59 | Admitting: Hematology & Oncology

## 2012-03-25 ENCOUNTER — Other Ambulatory Visit: Payer: 59 | Admitting: Lab

## 2012-04-08 ENCOUNTER — Ambulatory Visit (HOSPITAL_BASED_OUTPATIENT_CLINIC_OR_DEPARTMENT_OTHER): Payer: 59 | Admitting: Hematology & Oncology

## 2012-04-08 ENCOUNTER — Other Ambulatory Visit (HOSPITAL_BASED_OUTPATIENT_CLINIC_OR_DEPARTMENT_OTHER): Payer: 59 | Admitting: Lab

## 2012-04-08 VITALS — BP 101/50 | HR 64 | Temp 97.8°F | Resp 18 | Ht 62.0 in | Wt 111.0 lb

## 2012-04-08 DIAGNOSIS — D509 Iron deficiency anemia, unspecified: Secondary | ICD-10-CM

## 2012-04-08 DIAGNOSIS — D500 Iron deficiency anemia secondary to blood loss (chronic): Secondary | ICD-10-CM

## 2012-04-08 DIAGNOSIS — D510 Vitamin B12 deficiency anemia due to intrinsic factor deficiency: Secondary | ICD-10-CM

## 2012-04-08 DIAGNOSIS — K5090 Crohn's disease, unspecified, without complications: Secondary | ICD-10-CM

## 2012-04-08 DIAGNOSIS — K5289 Other specified noninfective gastroenteritis and colitis: Secondary | ICD-10-CM

## 2012-04-08 LAB — CBC WITH DIFFERENTIAL (CANCER CENTER ONLY)
BASO#: 0 10*3/uL (ref 0.0–0.2)
Eosinophils Absolute: 0.3 10*3/uL (ref 0.0–0.5)
HCT: 38.5 % (ref 34.8–46.6)
HGB: 12.3 g/dL (ref 11.6–15.9)
LYMPH#: 2.1 10*3/uL (ref 0.9–3.3)
MCH: 30.6 pg (ref 26.0–34.0)
MONO%: 6.5 % (ref 0.0–13.0)
NEUT#: 5 10*3/uL (ref 1.5–6.5)
NEUT%: 63.5 % (ref 39.6–80.0)
RBC: 4.02 10*6/uL (ref 3.70–5.32)

## 2012-04-08 LAB — VITAMIN B12: Vitamin B-12: 432 pg/mL (ref 211–911)

## 2012-04-08 LAB — FERRITIN: Ferritin: 512 ng/mL — ABNORMAL HIGH (ref 10–291)

## 2012-04-08 LAB — IRON AND TIBC
TIBC: 315 ug/dL (ref 250–470)
UIBC: 243 ug/dL (ref 125–400)

## 2012-04-08 NOTE — Progress Notes (Signed)
This office note has been dictated.

## 2012-04-09 NOTE — Progress Notes (Signed)
DIAGNOSES: 1. Crohn's disease. 2. Pernicious anemia. 3. Iron deficiency anemia.  CURRENT THERAPY: 1. IV iron as indicated, last received Feraheme in May. 2. Vitamin B12 1 mg IM q.2 weeks.  INTERIM HISTORY:  Denise Lawson comes in for followup.  Unfortunately, she developed shingles down the right L4 dermatome.  This was problem about 3-4 weeks ago.  She was put on antiviral.  She still has some postherpetic neuralgia.  She really does not want to take any medication for this.  Otherwise, she is doing fairly well.  When we saw her back in May, her ferritin was 217 with iron saturation of only 13%.  She is now taking oral HGH for the Crohn's.  She said this is helping her quite a bit.  She is under a lot of stress.  Her mom has dementia.  Denise Lawson is taking care of her mom.  She has had no problems bleeding.  She has had no "flare-ups" of the Crohn's disease.  PHYSICAL EXAMINATION:  General:  This is a well-developed, well- nourished white female in no obvious distress.  Vital signs:  Show temperature of 97.8, pulse 64, respiratory rate 18, blood pressure 101/50.  Weight is 111 pounds.  Head and neck:  Show a normocephalic, atraumatic skull.  There are no ocular or oral lesions.  There are no palpable cervical or supraclavicular lymph nodes.  Lungs:  Clear bilaterally.  Cardiac:  Regular rate and rhythm with normal S1, S2. There are no murmurs, rubs or bruits.  Abdomen:  Soft with good bowel sounds.  There is no palpable abdominal mass.  There is no fluid wave. No palpable hepatosplenomegaly.  Back:  No tenderness over the spine, ribs or hips.  Extremities:  Shows no clubbing, cyanosis or edema.  LABORATORY STUDIES:  White cell count is 7.9, hemoglobin 12.3, hematocrit 38.5, platelet count 281.  MCV is 96.  IMPRESSION:  Denise Lawson is a very nice 56 year old white female with Crohn's disease.  She has iron deficiency anemia.  She responds to IV iron.  I would be surprised if  her iron were still low.  We will go ahead and plan to get her back in another 3 months or so. Typically, we need to see her before the winter to get her iron into her.    ______________________________ Volanda Napoleon, M.D. PRE/MEDQ  D:  04/08/2012  T:  04/09/2012  Job:  [id number]

## 2012-04-11 ENCOUNTER — Telehealth: Payer: Self-pay | Admitting: *Deleted

## 2012-04-11 NOTE — Telephone Encounter (Signed)
Called patient to let her know that her iron levels are ok per dr. ennever 

## 2012-04-11 NOTE — Telephone Encounter (Signed)
Message copied by Anselm Jungling on Mon Apr 11, 2012 11:22 AM ------      Message from: Josph Macho      Created: Sun Apr 10, 2012  8:09 PM       Call - iron level is ok!!  pete

## 2012-07-14 ENCOUNTER — Other Ambulatory Visit: Payer: 59 | Admitting: Lab

## 2012-07-14 ENCOUNTER — Ambulatory Visit: Payer: 59 | Admitting: Hematology & Oncology

## 2012-07-15 ENCOUNTER — Other Ambulatory Visit: Payer: 59 | Admitting: Lab

## 2012-07-15 ENCOUNTER — Ambulatory Visit: Payer: 59 | Admitting: Medical

## 2012-07-15 ENCOUNTER — Ambulatory Visit: Payer: 59 | Admitting: Hematology & Oncology

## 2012-07-20 ENCOUNTER — Telehealth: Payer: Self-pay | Admitting: Hematology & Oncology

## 2012-07-20 NOTE — Telephone Encounter (Signed)
Patient called and cx 07/25/12 apt and stated she will call back to resch

## 2012-07-22 ENCOUNTER — Ambulatory Visit: Payer: 59 | Admitting: Medical

## 2012-07-22 ENCOUNTER — Other Ambulatory Visit: Payer: 59 | Admitting: Lab

## 2012-07-25 ENCOUNTER — Ambulatory Visit: Payer: 59 | Admitting: Hematology & Oncology

## 2012-07-25 ENCOUNTER — Other Ambulatory Visit: Payer: 59 | Admitting: Lab

## 2012-11-01 DIAGNOSIS — K5080 Crohn's disease of both small and large intestine without complications: Secondary | ICD-10-CM | POA: Insufficient documentation

## 2012-11-10 ENCOUNTER — Telehealth: Payer: Self-pay | Admitting: Hematology & Oncology

## 2012-11-10 NOTE — Telephone Encounter (Signed)
Pt made 4-14 appointment fells like her iron is low

## 2012-11-14 ENCOUNTER — Ambulatory Visit (HOSPITAL_BASED_OUTPATIENT_CLINIC_OR_DEPARTMENT_OTHER): Payer: 59 | Admitting: Hematology & Oncology

## 2012-11-14 ENCOUNTER — Other Ambulatory Visit (HOSPITAL_BASED_OUTPATIENT_CLINIC_OR_DEPARTMENT_OTHER): Payer: 59 | Admitting: Lab

## 2012-11-14 VITALS — BP 102/54 | HR 70 | Temp 98.0°F | Resp 16 | Ht 62.0 in | Wt 109.0 lb

## 2012-11-14 DIAGNOSIS — E0590 Thyrotoxicosis, unspecified without thyrotoxic crisis or storm: Secondary | ICD-10-CM

## 2012-11-14 DIAGNOSIS — D509 Iron deficiency anemia, unspecified: Secondary | ICD-10-CM

## 2012-11-14 DIAGNOSIS — D500 Iron deficiency anemia secondary to blood loss (chronic): Secondary | ICD-10-CM

## 2012-11-14 DIAGNOSIS — K5010 Crohn's disease of large intestine without complications: Secondary | ICD-10-CM

## 2012-11-14 DIAGNOSIS — K5090 Crohn's disease, unspecified, without complications: Secondary | ICD-10-CM

## 2012-11-14 DIAGNOSIS — D510 Vitamin B12 deficiency anemia due to intrinsic factor deficiency: Secondary | ICD-10-CM

## 2012-11-14 DIAGNOSIS — E559 Vitamin D deficiency, unspecified: Secondary | ICD-10-CM

## 2012-11-14 LAB — CBC WITH DIFFERENTIAL (CANCER CENTER ONLY)
Eosinophils Absolute: 0.3 10*3/uL (ref 0.0–0.5)
MONO#: 0.4 10*3/uL (ref 0.1–0.9)
NEUT#: 5.5 10*3/uL (ref 1.5–6.5)
Platelets: 275 10*3/uL (ref 145–400)
RBC: 4.38 10*6/uL (ref 3.70–5.32)
WBC: 8.3 10*3/uL (ref 3.9–10.0)

## 2012-11-14 LAB — IRON AND TIBC
%SAT: 15 % — ABNORMAL LOW (ref 20–55)
Iron: 48 ug/dL (ref 42–145)
UIBC: 269 ug/dL (ref 125–400)

## 2012-11-14 LAB — FERRITIN: Ferritin: 409 ng/mL — ABNORMAL HIGH (ref 10–291)

## 2012-11-14 LAB — CHCC SATELLITE - SMEAR

## 2012-11-14 LAB — THYROID PANEL WITH TSH - CHCC
T3 Uptake: 30.5 % (ref 22.5–37.0)
T4, Total: 8.6 ug/dL (ref 5.0–12.5)
TSH: 1.777 u[IU]/mL (ref 0.350–4.500)

## 2012-11-14 MED ORDER — VITAMIN B-12 1000 MCG SL SUBL: 1000.0000 ug | SUBLINGUAL_TABLET | SUBLINGUAL | Status: DC

## 2012-11-14 MED ORDER — ERGOCALCIFEROL 1.25 MG (50000 UT) PO CAPS: 50000.0000 [IU] | ORAL_CAPSULE | ORAL | Status: DC

## 2012-11-14 NOTE — Progress Notes (Signed)
This office note has been dictated.

## 2012-11-15 ENCOUNTER — Telehealth: Payer: Self-pay | Admitting: Oncology

## 2012-11-15 ENCOUNTER — Other Ambulatory Visit: Payer: Self-pay | Admitting: Oncology

## 2012-11-15 ENCOUNTER — Telehealth: Payer: Self-pay | Admitting: Hematology & Oncology

## 2012-11-15 DIAGNOSIS — D509 Iron deficiency anemia, unspecified: Secondary | ICD-10-CM

## 2012-11-15 MED ORDER — SODIUM CHLORIDE 0.9 % IV SOLN: 1020.0000 mg | Freq: Once | INTRAVENOUS | Status: DC

## 2012-11-15 NOTE — Telephone Encounter (Addendum)
Message copied by Lacie Draft on Tue Nov 15, 2012  3:24 PM ------      Message from: Arlan Organ R      Created: Mon Nov 14, 2012  6:01 PM       Call - iron is low, but thyroid tests are ok.  She might need a dose of Feraheme at 1020mg  x 1. Thanks!!  Cindee Lame ------Spoke with patient regarding iron, Tresa Endo will schedule.

## 2012-11-15 NOTE — Telephone Encounter (Signed)
Patient spoke with RN and per RN to sch iron apt.  Patient sch apt for 11/18/12

## 2012-11-16 NOTE — Progress Notes (Signed)
CC:   Denise Alar, NP [facility], [place], [place] [phone]  DIAGNOSES: 1. Recurrent iron-deficiency anemia. 2. Recurrent pernicious anemia. 3. Crohn disease.  CURRENT THERAPY: 1. IV iron as needed. 2. Vitamin B12 1 mg IM q.2 weeks.  INTERIM HISTORY:  Denise Lawson comes in for followup.  We last saw her back in September.  Since then, she has been to the [facility] at [facility].  She saw a doctor there.  She was having a flare- up of her Crohn's.  He wants to try to put her on a new medication, but she is not too keen on this.  She is, I think, losing some weight.  She has thyroid issues in her family.  She wants [place] to check her thyroid levels.  I think we can do this.  She has had no bleeding.  She has had poor appetite.  She has had some nausea.  She had lab work done at [facility] a couple of weeks ago.  At that time, her ferritin was 293 with an iron saturation of 21%.  Her B12 level was 414.  She did have a low vitamin D, which is not that surprising.  Her sed rate really was not all that elevated.  It was only 44.  PHYSICAL EXAM:  General:  This is a petite white female in no obvious distress.  Vital signs:  Temperature of 98, pulse 70, respiratory rate 16, blood pressure 102/54.  Weight is 109.  Head and neck: Normocephalic, atraumatic skull.  There are no ocular or oral lesions. There are no palpable cervical or supraclavicular lymph nodes.  Lungs: Clear bilaterally.  Cardiac:  Regular rate and rhythm with a normal S1 and S2.  There are no murmurs, rubs, or bruits.  Abdomen:  Soft with good bowel sounds.  There is no palpable abdominal mass.  There is no fluid wave.  There is no palpable hepatosplenomegaly.  She has a well- healed laparotomy scar.  Extremities:  No clubbing, cyanosis, or edema. Skin:  No rashes, ecchymosis, or petechia.  LABORATORY STUDIES:  White cell count is 8.3, hemoglobin 10.1, hematocrit 41.3,  platelet count 275.  IMPRESSION:  Denise Lawson is a very nice 57 year old white female with Crohn's.  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  She has iron- deficiency and pernicious anemia.  She does not need iron from my point of view.  She does the B12 shots at home.  We will go ahead and refill the vitamin D and vitamin B12.  Will plan to get her back in another 3-4 months.    ______________________________ Volanda Napoleon, M.D. PRE/MEDQ  D:  11/14/2012  T:  11/15/2012  Job:  [id number]

## 2012-11-18 ENCOUNTER — Ambulatory Visit (HOSPITAL_BASED_OUTPATIENT_CLINIC_OR_DEPARTMENT_OTHER): Payer: 59

## 2012-11-18 VITALS — BP 105/64 | HR 76 | Temp 98.7°F | Resp 16

## 2012-11-18 DIAGNOSIS — D509 Iron deficiency anemia, unspecified: Secondary | ICD-10-CM

## 2012-11-18 MED ORDER — SODIUM CHLORIDE 0.9 % IV SOLN
1020.0000 mg | Freq: Once | INTRAVENOUS | Status: AC
  Administered 2012-11-18: 1020 mg via INTRAVENOUS
  Filled 2012-11-18: qty 34

## 2012-11-18 MED ORDER — SODIUM CHLORIDE 0.9 % IV SOLN
Freq: Once | INTRAVENOUS | Status: AC
  Administered 2012-11-18: 15:00:00 via INTRAVENOUS

## 2012-11-18 NOTE — Patient Instructions (Signed)
Ferumoxytol injection What is this medicine? FERUMOXYTOL is an iron complex. Iron is used to make healthy red blood cells, which carry oxygen and nutrients throughout the body. This medicine is used to treat iron deficiency anemia in people with chronic kidney disease. This medicine may be used for other purposes; ask your health care provider or pharmacist if you have questions. What should I tell my health care provider before I take this medicine? They need to know if you have any of these conditions: -anemia not caused by low iron levels -high levels of iron in the blood -magnetic resonance imaging (MRI) test scheduled -an unusual or allergic reaction to iron, other medicines, foods, dyes, or preservatives -pregnant or trying to get pregnant -breast-feeding How should I use this medicine? This medicine is for infusion into a vein. It is given by a health care professional in a hospital or clinic setting. Talk to your pediatrician regarding the use of this medicine in children. Special care may be needed. Overdosage: If you think you've taken too much of this medicine contact a poison control center or emergency room at once. Overdosage: If you think you have taken too much of this medicine contact a poison control center or emergency room at once. NOTE: This medicine is only for you. Do not share this medicine with others. What if I miss a dose? It is important not to miss your dose. Call your doctor or health care professional if you are unable to keep an appointment. What may interact with this medicine? This medicine may interact with the following medications: -other iron products This list may not describe all possible interactions. Give your health care provider a list of all the medicines, herbs, non-prescription drugs, or dietary supplements you use. Also tell them if you smoke, drink alcohol, or use illegal drugs. Some items may interact with your medicine. What should I watch  for while using this medicine? Visit your doctor or healthcare professional regularly. Tell your doctor or healthcare professional if your symptoms do not start to get better or if they get worse. You may need blood work done while you are taking this medicine. You may need to follow a special diet. Talk to your doctor. Foods that contain iron include: whole grains/cereals, dried fruits, beans, or peas, leafy green vegetables, and organ meats (liver, kidney). What side effects may I notice from receiving this medicine? Side effects that you should report to your doctor or health care professional as soon as possible: -allergic reactions like skin rash, itching or hives, swelling of the face, lips, or tongue -breathing problems -changes in blood pressure -feeling faint or lightheaded, falls -fever or chills -flushing, sweating, or hot feelings -swelling of the ankles or feet Side effects that usually do not require medical attention (Report these to your doctor or health care professional if they continue or are bothersome.): -diarrhea -headache -nausea, vomiting -stomach pain This list may not describe all possible side effects. Call your doctor for medical advice about side effects. You may report side effects to FDA at 1-800-FDA-1088. Where should I keep my medicine? This drug is given in a hospital or clinic and will not be stored at home. NOTE: This sheet is a summary. It may not cover all possible information. If you have questions about this medicine, talk to your doctor, pharmacist, or health care provider.  2013, Elsevier/Gold Standard. (04/11/2008 9:48:25 PM)  

## 2013-03-13 ENCOUNTER — Ambulatory Visit: Payer: 59 | Admitting: Hematology & Oncology

## 2013-03-13 ENCOUNTER — Other Ambulatory Visit: Payer: 59 | Admitting: Lab

## 2013-03-16 ENCOUNTER — Telehealth: Payer: Self-pay | Admitting: Hematology & Oncology

## 2013-03-16 NOTE — Telephone Encounter (Signed)
Pt cx 8-15 said was on vacation and would call back to reschedule

## 2013-03-17 ENCOUNTER — Other Ambulatory Visit: Payer: 59 | Admitting: Lab

## 2013-03-17 ENCOUNTER — Ambulatory Visit: Payer: 59 | Admitting: Hematology & Oncology

## 2013-03-30 DIAGNOSIS — D649 Anemia, unspecified: Secondary | ICD-10-CM | POA: Insufficient documentation

## 2013-03-30 DIAGNOSIS — K5090 Crohn's disease, unspecified, without complications: Secondary | ICD-10-CM | POA: Insufficient documentation

## 2013-04-14 ENCOUNTER — Encounter (HOSPITAL_COMMUNITY): Payer: Self-pay | Admitting: *Deleted

## 2013-04-14 ENCOUNTER — Encounter (INDEPENDENT_AMBULATORY_CARE_PROVIDER_SITE_OTHER): Payer: Self-pay | Admitting: General Surgery

## 2013-04-14 ENCOUNTER — Ambulatory Visit (INDEPENDENT_AMBULATORY_CARE_PROVIDER_SITE_OTHER): Payer: 59 | Admitting: General Surgery

## 2013-04-14 VITALS — BP 98/62 | HR 72 | Temp 98.8°F | Resp 15 | Ht 61.5 in | Wt 105.2 lb

## 2013-04-14 DIAGNOSIS — L02419 Cutaneous abscess of limb, unspecified: Secondary | ICD-10-CM

## 2013-04-14 NOTE — Progress Notes (Signed)
Patient ID: [name], female   DOB: [birth year], 56 y.o.   MRN: [id number]  Chief Complaint  Patient presents with  . Follow-up    check wound on back of right leg    HPI Denise Lawson is a 57 y.o. female.   HPI She is referred by Denise Coombs, PA, for further evaluation and treatment of a infected right leg wound. She states she noticed a wound on the back of her right calf about 4 weeks ago. She is not sure if she had an insect bite or a spider bite. She did not seek medical attention until 2 weeks ago. She states she had spontaneous drainage from the area. She was placed on doxycycline. She recently saw Denise Lawson and the area was looking worse especially over the past 3-4 days. She was given a shot of Rocephin and we were asked to see her. She denies fever or chills. She does have a history of Crohn's disease but she is not on any immunosuppressive medication.  Past Medical History  Diagnosis Date  . Blood transfusion without reported diagnosis    Crohn's disease  Past Surgical History  Procedure Laterality Date  . Abdominal hysterectomy    . Bowel resection      Family History  Problem Relation Age of Onset  . Heart disease Father     Social History History  Substance Use Topics  . Smoking status: Former Smoker    Quit date: 08/03/1978  . Smokeless tobacco: Never Used  . Alcohol Use: No    Allergies  Allergen Reactions  . Darvon [Propoxyphene Hcl]     Hives  . Iohexol      Desc: VOMITING-10 YRS AGO @ [facility]     Current Outpatient Prescriptions  Medication Sig Dispense Refill  . Cholecalciferol (VITAMIN D3) LIQD Take by mouth once a week.       . Cyanocobalamin (VITAMIN B-12) 1000 MCG SUBL Take 1 tablet (1,000 mcg total) by mouth once a week.  12 tablet  3  . doxycycline (DORYX) 100 MG DR capsule Take 100 mg by mouth 2 (two) times daily.      . ergocalciferol (VITAMIN D2) 50000 UNITS capsule Take 1 capsule (50,000 Units total) by mouth once a  week.  4 capsule  12  . MULTIPLE VITAMINS PO Take by mouth. Liquid multiple vitamin      . NON FORMULARY Take by mouth every morning. Sera-Vital HGH       No current facility-administered medications for this visit.    Review of Systems Review of Systems  Constitutional: Negative for fever and chills.  Gastrointestinal: Positive for abdominal pain.  Musculoskeletal:       Left leg swelling and redness.     Blood pressure 98/62, pulse 72, temperature 98.8 F (37.1 C), temperature source Temporal, resp. rate 15, height 5' 1.5" (1.562 m), weight 105 lb 3.2 oz (47.718 kg).  Physical Exam Physical Exam  Constitutional: She appears well-developed and well-nourished. No distress.  HENT:  Head: Normocephalic and atraumatic.  Cardiovascular: Normal rate and regular rhythm.   Pulmonary/Chest: Effort normal and breath sounds normal.  Abdominal: Soft. There is no tenderness.  Midline scar.  Musculoskeletal:  Right posterior calf demonstrates a large area of erythema and induration. There are 4 fluctuant areas around this that are tender. There is a small amount of right foot swelling    Data Reviewed Note from primary care physician's office  Assessment    Complicated right  calf abscess with surrounding cellulitis. This has failed oral antibiotic therapy. The process is progressive.     Plan    She will come to the operating room tomorrow for incision, drainage, and debridement by Dr. Excell Seltzer.  She is unable to go tonight because she has to take care of her mother who has dementia.  Dr. Excell Seltzer saw her here in the office. The procedure and risks were discussed with her. Risks include but are not limited to bleeding, infection, wound problems, need for reoperation, anesthesia.       Denise Lawson J 04/14/2013, 5:21 PM

## 2013-04-14 NOTE — Patient Instructions (Signed)
Nothing to eat or drink for 8 hours prior to your surgery.  You may end up staying in the hospital after the surgery.

## 2013-04-15 ENCOUNTER — Ambulatory Visit (HOSPITAL_COMMUNITY)
Admission: RE | Admit: 2013-04-15 | Discharge: 2013-04-15 | Disposition: A | Discharge status: Home / Self Care | Payer: 59 | Source: Ambulatory Visit | Attending: General Surgery | Admitting: General Surgery

## 2013-04-15 ENCOUNTER — Encounter (HOSPITAL_COMMUNITY)
Admission: RE | Disposition: A | Discharge status: Home / Self Care | Payer: Self-pay | Source: Ambulatory Visit | Attending: General Surgery

## 2013-04-15 ENCOUNTER — Encounter (HOSPITAL_COMMUNITY): Payer: Self-pay | Admitting: Anesthesiology

## 2013-04-15 ENCOUNTER — Ambulatory Visit (HOSPITAL_COMMUNITY): Payer: 59 | Admitting: Anesthesiology

## 2013-04-15 ENCOUNTER — Encounter (HOSPITAL_COMMUNITY): Payer: Self-pay | Admitting: *Deleted

## 2013-04-15 DIAGNOSIS — L03119 Cellulitis of unspecified part of limb: Secondary | ICD-10-CM

## 2013-04-15 DIAGNOSIS — I739 Peripheral vascular disease, unspecified: Secondary | ICD-10-CM | POA: Insufficient documentation

## 2013-04-15 DIAGNOSIS — L02419 Cutaneous abscess of limb, unspecified: Secondary | ICD-10-CM

## 2013-04-15 HISTORY — DX: Other female genital tract fistulae: N82.8

## 2013-04-15 HISTORY — DX: Crohn's disease, unspecified, without complications: K50.90

## 2013-04-15 HISTORY — DX: Nausea with vomiting, unspecified: R11.2

## 2013-04-15 HISTORY — DX: Peripheral vascular disease, unspecified: I73.9

## 2013-04-15 HISTORY — DX: Other specified postprocedural states: Z98.890

## 2013-04-15 HISTORY — DX: Anemia, unspecified: D64.9

## 2013-04-15 HISTORY — PX: INCISION AND DRAINAGE ABSCESS: SHX5864

## 2013-04-15 HISTORY — DX: Disease of pericardium, unspecified: I31.9

## 2013-04-15 LAB — CBC WITH DIFFERENTIAL/PLATELET
Basophils Absolute: 0 10*3/uL (ref 0.0–0.1)
Basophils Relative: 0 % (ref 0–1)
Lymphocytes Relative: 20 % (ref 12–46)
MCHC: 31.8 g/dL (ref 30.0–36.0)
Monocytes Absolute: 0.6 10*3/uL (ref 0.1–1.0)
Neutro Abs: 6.2 10*3/uL (ref 1.7–7.7)
Neutrophils Relative %: 69 % (ref 43–77)
Platelets: 282 10*3/uL (ref 150–400)
RDW: 13.1 % (ref 11.5–15.5)
WBC: 8.9 10*3/uL (ref 4.0–10.5)

## 2013-04-15 LAB — BASIC METABOLIC PANEL
BUN: 11 mg/dL (ref 6–23)
Calcium: 9.5 mg/dL (ref 8.4–10.5)
Chloride: 104 mEq/L (ref 96–112)
Creatinine, Ser: 0.74 mg/dL (ref 0.50–1.10)
GFR calc Af Amer: >90 mL/min (ref >90)
GFR calc non Af Amer: >90 mL/min (ref >90)

## 2013-04-15 SURGERY — INCISION AND DRAINAGE, ABSCESS
Anesthesia: General | Site: Leg Lower | Laterality: Right | Wound class: Dirty or Infected

## 2013-04-15 MED ORDER — PROMETHAZINE HCL 25 MG/ML IJ SOLN: 6.2500 mg | INTRAMUSCULAR | Status: DC | PRN

## 2013-04-15 MED ORDER — HYDROCODONE-ACETAMINOPHEN 5-325 MG PO TABS
ORAL_TABLET | ORAL | Status: AC
  Administered 2013-04-15: 2
  Filled 2013-04-15: qty 2

## 2013-04-15 MED ORDER — LACTATED RINGERS IV SOLN: INTRAVENOUS | Status: DC

## 2013-04-15 MED ORDER — HEPARIN SODIUM (PORCINE) 5000 UNIT/ML IJ SOLN
5000.0000 [IU] | Freq: Once | INTRAMUSCULAR | Status: AC
  Administered 2013-04-15: 5000 [IU] via SUBCUTANEOUS
  Filled 2013-04-15: qty 1

## 2013-04-15 MED ORDER — ONDANSETRON HCL 4 MG/2ML IJ SOLN
INTRAMUSCULAR | Status: DC | PRN
  Administered 2013-04-15: 4 mg via INTRAVENOUS

## 2013-04-15 MED ORDER — DEXAMETHASONE SODIUM PHOSPHATE 4 MG/ML IJ SOLN
INTRAMUSCULAR | Status: DC | PRN
  Administered 2013-04-15: 10 mg via INTRAVENOUS

## 2013-04-15 MED ORDER — FENTANYL CITRATE 0.05 MG/ML IJ SOLN
INTRAMUSCULAR | Status: DC | PRN
  Administered 2013-04-15: 50 ug via INTRAVENOUS
  Administered 2013-04-15: 25 ug via INTRAVENOUS

## 2013-04-15 MED ORDER — PROPOFOL 10 MG/ML IV BOLUS
INTRAVENOUS | Status: DC | PRN
  Administered 2013-04-15: 175 mg via INTRAVENOUS

## 2013-04-15 MED ORDER — 0.9 % SODIUM CHLORIDE (POUR BTL) OPTIME
TOPICAL | Status: DC | PRN
  Administered 2013-04-15: 1000 mL

## 2013-04-15 MED ORDER — HYDROCODONE-ACETAMINOPHEN 5-325 MG PO TABS: 1.0000 | ORAL_TABLET | ORAL | Status: DC | PRN

## 2013-04-15 MED ORDER — CEFAZOLIN SODIUM-DEXTROSE 2-3 GM-% IV SOLR
2.0000 g | INTRAVENOUS | Status: AC
  Administered 2013-04-15: 2 g via INTRAVENOUS

## 2013-04-15 MED ORDER — LIDOCAINE HCL (CARDIAC) 20 MG/ML IV SOLN
INTRAVENOUS | Status: DC | PRN
  Administered 2013-04-15: 100 mg via INTRAVENOUS

## 2013-04-15 MED ORDER — HYDROMORPHONE HCL PF 1 MG/ML IJ SOLN
0.2500 mg | INTRAMUSCULAR | Status: DC | PRN
  Administered 2013-04-15 (×2): 0.5 mg via INTRAVENOUS

## 2013-04-15 MED ORDER — HYDROMORPHONE HCL PF 1 MG/ML IJ SOLN
INTRAMUSCULAR | Status: AC
  Filled 2013-04-15: qty 1

## 2013-04-15 MED ORDER — MIDAZOLAM HCL 5 MG/5ML IJ SOLN
INTRAMUSCULAR | Status: DC | PRN
  Administered 2013-04-15: 2 mg via INTRAVENOUS

## 2013-04-15 MED ORDER — CEFAZOLIN SODIUM-DEXTROSE 2-3 GM-% IV SOLR
INTRAVENOUS | Status: AC
  Filled 2013-04-15: qty 50

## 2013-04-15 MED ORDER — LACTATED RINGERS IV SOLN
INTRAVENOUS | Status: DC | PRN
  Administered 2013-04-15: 10:00:00 via INTRAVENOUS

## 2013-04-15 SURGICAL SUPPLY — 32 items
BANDAGE ELASTIC 4 VELCRO ST LF (GAUZE/BANDAGES/DRESSINGS) ×2 IMPLANT
BANDAGE GAUZE ELAST BULKY 4 IN (GAUZE/BANDAGES/DRESSINGS) ×1 IMPLANT
BLADE SURG 15 STRL LF DISP TIS (BLADE) ×1 IMPLANT
BLADE SURG 15 STRL SS (BLADE) ×2
CANISTER SUCTION 2500CC (MISCELLANEOUS) ×2 IMPLANT
CLOTH BEACON ORANGE TIMEOUT ST (SAFETY) ×2 IMPLANT
COVER SURGICAL LIGHT HANDLE (MISCELLANEOUS) ×2 IMPLANT
DECANTER SPIKE VIAL GLASS SM (MISCELLANEOUS) IMPLANT
DRAPE LAPAROSCOPIC ABDOMINAL (DRAPES) IMPLANT
DRAPE LAPAROTOMY T 102X78X121 (DRAPES) ×1 IMPLANT
DRSG PAD ABDOMINAL 8X10 ST (GAUZE/BANDAGES/DRESSINGS) IMPLANT
ELECT REM PT RETURN 9FT ADLT (ELECTROSURGICAL) ×2
ELECTRODE REM PT RTRN 9FT ADLT (ELECTROSURGICAL) ×1 IMPLANT
GAUZE PACKING IODOFORM 1/4X5 (PACKING) ×1 IMPLANT
GLOVE BIO SURGEON STRL SZ7.5 (GLOVE) ×2 IMPLANT
GOWN PREVENTION PLUS LG XLONG (DISPOSABLE) ×4 IMPLANT
KIT BASIN OR (CUSTOM PROCEDURE TRAY) ×2 IMPLANT
NEEDLE HYPO 25X1 1.5 SAFETY (NEEDLE) IMPLANT
NS IRRIG 1000ML POUR BTL (IV SOLUTION) ×2 IMPLANT
PENCIL BUTTON HOLSTER BLD 10FT (ELECTRODE) ×2 IMPLANT
SPONGE GAUZE 4X4 12PLY (GAUZE/BANDAGES/DRESSINGS) ×1 IMPLANT
SPONGE LAP 18X18 X RAY DECT (DISPOSABLE) ×2 IMPLANT
SUT MNCRL AB 4-0 PS2 18 (SUTURE) IMPLANT
SUT VIC AB 3-0 SH 27 (SUTURE)
SUT VIC AB 3-0 SH 27XBRD (SUTURE) IMPLANT
SWAB COLLECTION DEVICE MRSA (MISCELLANEOUS) ×1 IMPLANT
SYR BULB 3OZ (MISCELLANEOUS) ×2 IMPLANT
SYR CONTROL 10ML LL (SYRINGE) ×1 IMPLANT
TOWEL OR 17X26 10 PK STRL BLUE (TOWEL DISPOSABLE) ×2 IMPLANT
TUBE ANAEROBIC SPECIMEN COL (MISCELLANEOUS) ×1 IMPLANT
WATER STERILE IRR 1000ML POUR (IV SOLUTION) ×2 IMPLANT
YANKAUER SUCT BULB TIP NO VENT (SUCTIONS) ×2 IMPLANT

## 2013-04-15 NOTE — Op Note (Signed)
Preoperative Diagnosis: right calf abscess  Postoprative Diagnosis: right calf abscess  Procedure: Procedure(s): INCISION AND DRAINAGE AND DEBRIDEMENT RIGHT CALF ABSCESS   Surgeon: Excell Seltzer T   Assistants: None  Anesthesia:  General endotracheal anesthesia  Indications: Patient is a 57 year old female who approximately 4 weeks ago noticed a small wound on the back of her right calf or possibly an insect or spider bite. She gradually developed increasing redness and swelling and some drainage and sought attention in 2 weeks later. At that time she was started on oral antibiotics. Followup by her primary physician showed the area to look worse and she was seen in our office yesterday. See Dr. Bertrum Sol notes for details. She was found to have several areas of fluctuance and approximately 8 or 9 cm area of erythema surrounding these. We have recommended proceeding with incision and drainage and possible debridement based on operative findings. Who discussed the nature of the procedure and the fact she'll have an open wound that require wound care.    Procedure Detail:  Patient was brought to the operating room, placed in supine position on the operating table and general endotracheal anesthesia induced. She was carefully positioned in the left lateral decubitus position the posterior right calf is exposed which was sterilely prepped and draped. Patient time out was performed the correct procedure verified. She had been given broad-spectrum IV antibiotics preoperatively. Examination of the posterior calf showed again in about a 8 or 9 cm Area of erythema and induration with several small punctate areas of skin necrosis and limited areas of underlying fluctuance beneath each of these. I initially elliptically excised the largest area of punctate skin necrosis and incised into a small pocket of purulence and what appeared to be liquefied or necrotic fat beneath this. This was about a 8 or 9  mm opening. This was drained and debrided and cultured. The wound tract a little bit underneath the skin but did not seem to connect to any large pocket of pus or necrotic tissue. I then did similar excisions of 4 other areas which appeared similar in each case there was a small area of purulence and a localized area of necrotic fat but no deep communication to any large pocket or to the other areas. These wounds were irrigated and each was packed with 1/4 inch 4 gauze. The leg was dressed with Kerlix and an Ace bandage. She is to return for office in 48 hours for dressing change.    Findings: As above  Estimated Blood Loss:  Minimal         Drains: wounds packed with 1/4 inch iodoform gauze  Blood Given: none          Specimens: Culture and sensitivity and  skin and subcutaneous tissue        Complications:  * No complications entered in OR log *         Disposition: PACU - hemodynamically stable.         Condition: stable

## 2013-04-15 NOTE — Preoperative (Signed)
Beta Blockers   Reason not to administer Beta Blockers:Not Applicable 

## 2013-04-15 NOTE — Anesthesia Postprocedure Evaluation (Signed)
Anesthesia Post Note  Patient: Denise Lawson  Procedure(s) Performed: Procedure(s) (LRB): INCISION AND DRAINAGE AND DEBRIDEMENT RIGHT CALF ABSCESS (Right)  Anesthesia type: General  Patient location: PACU  Post pain: Pain level controlled  Post assessment: Post-op Vital signs reviewed  Last Vitals:  Filed Vitals:   04/15/13 1120  BP:   Pulse: 71  Temp:   Resp: 16    Post vital signs: Reviewed  Level of consciousness: sedated  Complications: No apparent anesthesia complications

## 2013-04-15 NOTE — Anesthesia Preprocedure Evaluation (Signed)
Anesthesia Evaluation  Patient identified by MRN, date of birth, ID band Patient awake    Reviewed: Allergy & Precautions, H&P , NPO status , Patient's Chart, lab work & pertinent test results  Airway Mallampati: II TM Distance: >3 FB Neck ROM: Full    Dental  (+) Teeth Intact, Partial Lower and Dental Advisory Given   Pulmonary neg pulmonary ROS, former smoker,  breath sounds clear to auscultation  Pulmonary exam normal       Cardiovascular + Peripheral Vascular Disease negative cardio ROS  Rhythm:Regular Rate:Normal     Neuro/Psych negative neurological ROS  negative psych ROS   GI/Hepatic negative GI ROS, Neg liver ROS, Crohns disease   Endo/Other  negative endocrine ROS  Renal/GU negative Renal ROS  negative genitourinary   Musculoskeletal negative musculoskeletal ROS (+)   Abdominal   Peds  Hematology negative hematology ROS (+) Blood dyscrasia, anemia ,   Anesthesia Other Findings   Reproductive/Obstetrics                           Anesthesia Physical Anesthesia Plan  ASA: II  Anesthesia Plan: General   Post-op Pain Management:    Induction: Intravenous  Airway Management Planned: LMA  Additional Equipment:   Intra-op Plan:   Post-operative Plan: Extubation in OR  Informed Consent: I have reviewed the patients History and Physical, chart, labs and discussed the procedure including the risks, benefits and alternatives for the proposed anesthesia with the patient or authorized representative who has indicated his/her understanding and acceptance.   Dental advisory given  Plan Discussed with: CRNA  Anesthesia Plan Comments:         Anesthesia Quick Evaluation

## 2013-04-15 NOTE — Transfer of Care (Signed)
Immediate Anesthesia Transfer of Care Note  Patient: Denise Lawson  Procedure(s) Performed: Procedure(s): INCISION AND DRAINAGE AND DEBRIDEMENT RIGHT CALF ABSCESS (Right)  Patient Location: PACU  Anesthesia Type:General  Level of Consciousness: sedated  Airway & Oxygen Therapy: Patient Spontanous Breathing and Patient connected to face mask oxygen  Post-op Assessment: Report given to PACU RN and Post -op Vital signs reviewed and stable  Post vital signs: Reviewed and stable  Complications: No apparent anesthesia complications

## 2013-04-17 ENCOUNTER — Encounter (HOSPITAL_COMMUNITY): Payer: Self-pay | Admitting: General Surgery

## 2013-04-17 ENCOUNTER — Ambulatory Visit (INDEPENDENT_AMBULATORY_CARE_PROVIDER_SITE_OTHER): Payer: 59 | Admitting: General Surgery

## 2013-04-17 VITALS — BP 104/62 | HR 68 | Resp 14 | Ht 62.0 in | Wt 103.6 lb

## 2013-04-17 DIAGNOSIS — L02419 Cutaneous abscess of limb, unspecified: Secondary | ICD-10-CM

## 2013-04-17 DIAGNOSIS — L03116 Cellulitis of left lower limb: Secondary | ICD-10-CM

## 2013-04-17 NOTE — Progress Notes (Signed)
Denise Lawson is a 57 y.o. female who is status post a I&D of calf on 9/13.  She is doing better.  She denies any pain.  Objective: Filed Vitals:   04/17/13 1505  BP: 104/62  Pulse: 68  Resp: 14    General appearance: alert and cooperative  Incision: no significant drainage Mild erythema around incision site, no edema, tissue appears healthy Packing removed  Assessment: s/p  Patient Active Problem List   Diagnosis Date Noted  . Cellulitis and abscess of leg-right 04/14/2013    Plan: Return to office in 7-10 days to see Dr Excell Seltzer Complete antibiotics course Ok to return to light activity Keep elevated when lying or sitting Change dressing daily    .Rosario Adie, [facility], [place]   04/17/2013 3:17 PM

## 2013-04-17 NOTE — Patient Instructions (Signed)
Return to office in 7-10 days to see Dr Excell Seltzer Complete antibiotics course Ok to return to light activity Keep elevated when lying or sitting Change dressing daily Ok to shower.  No tub baths.

## 2013-04-19 LAB — CULTURE, ROUTINE-ABSCESS: Culture: NO GROWTH

## 2013-04-20 LAB — ANAEROBIC CULTURE

## 2013-04-28 ENCOUNTER — Encounter (INDEPENDENT_AMBULATORY_CARE_PROVIDER_SITE_OTHER): Payer: Self-pay | Admitting: General Surgery

## 2013-04-28 ENCOUNTER — Ambulatory Visit (INDEPENDENT_AMBULATORY_CARE_PROVIDER_SITE_OTHER): Payer: 59 | Admitting: General Surgery

## 2013-04-28 VITALS — BP 114/60 | HR 72 | Temp 98.7°F | Resp 14 | Ht 62.0 in | Wt 105.0 lb

## 2013-04-28 DIAGNOSIS — L02419 Cutaneous abscess of limb, unspecified: Secondary | ICD-10-CM

## 2013-04-28 NOTE — Progress Notes (Signed)
History: Patient returns following incision and drainage of subcutaneous abscess of her calf. She reports she is doing very well. She has completed her antibiotics. Cultures did not grow anything. She had been on previous antibiotic treatment.  Exam: On the posterior calf is some minimal residual erythema that is about completely gone. 3 small I&D sites just a few millimeters showed clean granulation and no tracking or drainage.  Assessment and plan: Doing well following incision and drainage debridement of Abscess. It is essentially completely healed. She will call as needed is not completely healed in the next couple of weeks.

## 2013-05-02 ENCOUNTER — Encounter (INDEPENDENT_AMBULATORY_CARE_PROVIDER_SITE_OTHER): Payer: Self-pay

## 2013-11-07 ENCOUNTER — Telehealth: Payer: Self-pay | Admitting: Hematology & Oncology

## 2013-11-07 NOTE — Telephone Encounter (Signed)
Patient called resch 03/17/13 missed apt for 11/19/13

## 2013-11-09 ENCOUNTER — Other Ambulatory Visit (HOSPITAL_BASED_OUTPATIENT_CLINIC_OR_DEPARTMENT_OTHER): Payer: 59 | Admitting: Lab

## 2013-11-09 ENCOUNTER — Encounter: Payer: Self-pay | Admitting: Hematology & Oncology

## 2013-11-09 ENCOUNTER — Ambulatory Visit (HOSPITAL_BASED_OUTPATIENT_CLINIC_OR_DEPARTMENT_OTHER): Payer: 59 | Admitting: Hematology & Oncology

## 2013-11-09 VITALS — BP 106/50 | HR 84 | Temp 98.2°F | Resp 14 | Ht 62.0 in | Wt 103.0 lb

## 2013-11-09 DIAGNOSIS — D510 Vitamin B12 deficiency anemia due to intrinsic factor deficiency: Secondary | ICD-10-CM

## 2013-11-09 DIAGNOSIS — E559 Vitamin D deficiency, unspecified: Secondary | ICD-10-CM

## 2013-11-09 DIAGNOSIS — D509 Iron deficiency anemia, unspecified: Secondary | ICD-10-CM

## 2013-11-09 DIAGNOSIS — K5010 Crohn's disease of large intestine without complications: Secondary | ICD-10-CM

## 2013-11-09 DIAGNOSIS — K9089 Other intestinal malabsorption: Secondary | ICD-10-CM

## 2013-11-09 DIAGNOSIS — D508 Other iron deficiency anemias: Secondary | ICD-10-CM

## 2013-11-09 DIAGNOSIS — K909 Intestinal malabsorption, unspecified: Secondary | ICD-10-CM

## 2013-11-09 DIAGNOSIS — K50113 Crohn's disease of large intestine with fistula: Secondary | ICD-10-CM

## 2013-11-09 LAB — CBC WITH DIFFERENTIAL (CANCER CENTER ONLY)
BASO#: 0 10*3/uL (ref 0.0–0.2)
BASO%: 0.3 % (ref 0.0–2.0)
EOS%: 1.6 % (ref 0.0–7.0)
Eosinophils Absolute: 0.2 10*3/uL (ref 0.0–0.5)
HCT: 36.2 % (ref 34.8–46.6)
HGB: 11.5 g/dL — ABNORMAL LOW (ref 11.6–15.9)
LYMPH#: 1.8 10*3/uL (ref 0.9–3.3)
LYMPH%: 18 % (ref 14.0–48.0)
MCH: 30.1 pg (ref 26.0–34.0)
MCHC: 31.8 g/dL — ABNORMAL LOW (ref 32.0–36.0)
MCV: 95 fL (ref 81–101)
MONO#: 0.7 10*3/uL (ref 0.1–0.9)
MONO%: 7.1 % (ref 0.0–13.0)
NEUT#: 7.2 10*3/uL — ABNORMAL HIGH (ref 1.5–6.5)
NEUT%: 73 % (ref 39.6–80.0)
PLATELETS: 288 10*3/uL (ref 145–400)
RBC: 3.82 10*6/uL (ref 3.70–5.32)
RDW: 13.2 % (ref 11.1–15.7)
WBC: 9.8 10*3/uL (ref 3.9–10.0)

## 2013-11-09 LAB — CHCC SATELLITE - SMEAR

## 2013-11-10 LAB — IRON AND TIBC CHCC
%SAT: 10 % — ABNORMAL LOW (ref 21–57)
Iron: 26 ug/dL — ABNORMAL LOW (ref 41–142)
TIBC: 248 ug/dL (ref 236–444)
UIBC: 222 ug/dL (ref 120–384)

## 2013-11-10 LAB — VITAMIN D 25 HYDROXY (VIT D DEFICIENCY, FRACTURES): VIT D 25 HYDROXY: 21 ng/mL — AB (ref 30–89)

## 2013-11-10 LAB — VITAMIN B12: Vitamin B-12: 320 pg/mL (ref 211–911)

## 2013-11-10 LAB — FERRITIN CHCC: Ferritin: 556 ng/ml — ABNORMAL HIGH (ref 9–269)

## 2013-11-13 ENCOUNTER — Telehealth: Payer: Self-pay | Admitting: Nurse Practitioner

## 2013-11-13 ENCOUNTER — Telehealth: Payer: Self-pay | Admitting: *Deleted

## 2013-11-13 ENCOUNTER — Other Ambulatory Visit: Payer: Self-pay | Admitting: Nurse Practitioner

## 2013-11-13 ENCOUNTER — Encounter: Payer: Self-pay | Admitting: Hematology & Oncology

## 2013-11-13 DIAGNOSIS — D510 Vitamin B12 deficiency anemia due to intrinsic factor deficiency: Secondary | ICD-10-CM

## 2013-11-13 DIAGNOSIS — D509 Iron deficiency anemia, unspecified: Secondary | ICD-10-CM

## 2013-11-13 DIAGNOSIS — K909 Intestinal malabsorption, unspecified: Secondary | ICD-10-CM | POA: Insufficient documentation

## 2013-11-13 DIAGNOSIS — E559 Vitamin D deficiency, unspecified: Secondary | ICD-10-CM

## 2013-11-13 DIAGNOSIS — K50113 Crohn's disease of large intestine with fistula: Secondary | ICD-10-CM

## 2013-11-13 HISTORY — DX: Crohn's disease of large intestine with fistula: K50.113

## 2013-11-13 HISTORY — DX: Intestinal malabsorption, unspecified: K90.9

## 2013-11-13 HISTORY — DX: Vitamin B12 deficiency anemia due to intrinsic factor deficiency: D51.0

## 2013-11-13 HISTORY — DX: Iron deficiency anemia, unspecified: D50.9

## 2013-11-13 MED ORDER — ERGOCALCIFEROL 1.25 MG (50000 UT) PO CAPS: 50000.0000 [IU] | ORAL_CAPSULE | ORAL | Status: DC

## 2013-11-13 NOTE — Telephone Encounter (Addendum)
Message copied by Mirian Capuchin on Mon Nov 13, 2013 11:15 AM ------      Message from: Josph Macho      Created: Sun Nov 12, 2013  8:39 PM       Call - iron is low.  Please set up feraheme 1020mg  x 1 dose this week.  pete ------This message given to pt's husband.  He will ask pt to call and schedule this for this week.

## 2013-11-13 NOTE — Progress Notes (Signed)
Per Dr. Myna Hidalgo her Vitamin b12 was ok however her vit D was low. Pt instructed to take 50,000 u/week and rx sent in to pharmacy of choice. Pt verbalized understanding and appreciation.

## 2013-11-13 NOTE — Progress Notes (Signed)
Hematology and Oncology Follow Up Visit  Denise Lawson [id number] 1955-09-22 58 y.o. 11/13/2013   Principle Diagnosis:  . Recurrent iron-deficiency anemia. 2. Recurrent pernicious anemia. 3. Crohn disease.  Current Therapy:   IV iron as needed. 2. Vitamin B12 1 mg IM q.2 weeks.     Interim History:  Ms.  Denise Lawson is back for followup. His blood a year since we last saw her. She has been having some issues with the Crohn's disease. Perhaps she feels tired. She feels better on is probably low. We usually have to give her iron every time we see her.  She's had no obvious bleeding. She's had some abdominal pain. She's had some nausea vomiting. Generally, her Crohn's flares up during the wintertime.  She's had no cough. There's been no fever. She had no leg swelling. There's been no headache. Medications: Current outpatient prescriptions:Cyanocobalamin (VITAMIN B 12 PO), Take by mouth every morning., Disp: , Rfl: ;  ibuprofen (ADVIL,MOTRIN) 200 MG tablet, Take 400 mg by mouth every 6 (six) hours as needed for pain. , Disp: , Rfl:   Allergies:  Allergies  Allergen Reactions  . Darvon [Propoxyphene Hcl]     Hives  . Iohexol      Desc: VOMITING-10 YRS AGO @ [facility].     Past Medical History, Surgical history, Social history, and Family History were reviewed and updated.  Review of Systems: As above  Physical Exam:  height is 5' 2"  (1.575 m) and weight is 103 lb (46.72 kg). Her oral temperature is 98.2 F (36.8 C). Her blood pressure is 106/50 and her pulse is 84. Her respiration is 14.   1 well-nourished white female. Her head exam shows no ocular or oral lesions. Sclera there is no sclera icterus. Conjunctiva slightly pale. No oral lesion. Neck is supple with no lymphadenopathy. Lungs are clear. Cardiac exam regular in rhythm with no murmurs rubs or bruits. Abdomen is soft. Has good bowel sounds. There is no fluid wave. There is no palpable hepatosplenomegaly y. She has a  well-healed laparotomy scar. There is no obvious abdominal mass. Exam no tenderness over the spine. Extremities no clubbing cyanosis or edema. Neurological exam no focal deficits.  Lab Results  Component Value Date   WBC 9.8 11/09/2013   HGB 11.5* 11/09/2013   HCT 36.2 11/09/2013   MCV 95 11/09/2013   PLT 288 11/09/2013     Chemistry      Component Value Date/Time   NA 138 04/15/2013 0825   K 3.6 04/15/2013 0825   CL 104 04/15/2013 0825   CO2 25 04/15/2013 0825   BUN 11 04/15/2013 0825   CREATININE 0.74 04/15/2013 0825      Component Value Date/Time   CALCIUM 9.5 04/15/2013 0825   ALKPHOS 92 05/09/2010 0840   AST 14 05/09/2010 0840   ALT 19 05/09/2010 0840   BILITOT 0.3 05/09/2010 0840     Her ferritin is 556 put her iron saturation is only 10%. Her B12 is 320. Her vitamin D is 21.   Impression and Plan: Ms. Denise Lawson is a 58 year old white female. His Crohn's disease. She has malabsorption. She just does not absorb iron. We will have to give her iron. She always responds to IV iron. She also will need vitamin D.  We will get her set up with iron in one week.  We also will give her a dose of vitamin B12.  I would like to see her back in another 6 weeks.  Volanda Napoleon, MD 4/13/20152:28 PM

## 2013-11-13 NOTE — Telephone Encounter (Addendum)
Message copied by Glee ArvinPICKENPACK-COUSAR, Anastasija Anfinson N on Mon Nov 13, 2013 11:05 AM ------      Message from: Josph MachoENNEVER, PETER R      Created: Sun Nov 12, 2013  8:39 PM       Call - iron is low.  Please set up feraheme 1020mg  x 1 dose this week.  pete ------Pt verbalized understanding and appreciation. She will be scheduled as requested for 11/17/13 @ 0830.

## 2013-11-17 ENCOUNTER — Ambulatory Visit (HOSPITAL_BASED_OUTPATIENT_CLINIC_OR_DEPARTMENT_OTHER): Payer: 59

## 2013-11-17 VITALS — BP 102/64 | HR 97 | Temp 98.4°F | Resp 16

## 2013-11-17 DIAGNOSIS — K909 Intestinal malabsorption, unspecified: Secondary | ICD-10-CM

## 2013-11-17 DIAGNOSIS — D509 Iron deficiency anemia, unspecified: Secondary | ICD-10-CM

## 2013-11-17 DIAGNOSIS — K5010 Crohn's disease of large intestine without complications: Secondary | ICD-10-CM

## 2013-11-17 MED ORDER — SODIUM CHLORIDE 0.9 % IV SOLN
Freq: Once | INTRAVENOUS | Status: AC
Start: 2013-11-17 — End: 2013-11-17
  Administered 2013-11-17: 09:00:00 via INTRAVENOUS

## 2013-11-17 MED ORDER — SODIUM CHLORIDE 0.9 % IV SOLN
1020.0000 mg | Freq: Once | INTRAVENOUS | Status: AC
  Administered 2013-11-17 (×2): 1020 mg via INTRAVENOUS
  Filled 2013-11-17: qty 34

## 2013-11-17 MED ORDER — IBUPROFEN 200 MG PO TABS
ORAL_TABLET | ORAL | Status: AC
  Filled 2013-11-17: qty 2

## 2013-11-17 NOTE — Patient Instructions (Signed)

## 2013-12-15 ENCOUNTER — Telehealth: Payer: Self-pay | Admitting: Hematology & Oncology

## 2013-12-15 NOTE — Telephone Encounter (Signed)
Pt moved 5-27 to 6-2 said she had another MD appointment that day

## 2013-12-27 ENCOUNTER — Ambulatory Visit: Payer: 59 | Admitting: Hematology & Oncology

## 2013-12-27 ENCOUNTER — Other Ambulatory Visit: Payer: 59 | Admitting: Lab

## 2013-12-27 ENCOUNTER — Ambulatory Visit: Payer: 59

## 2014-01-01 ENCOUNTER — Other Ambulatory Visit: Payer: Self-pay | Admitting: *Deleted

## 2014-01-01 DIAGNOSIS — D509 Iron deficiency anemia, unspecified: Secondary | ICD-10-CM

## 2014-01-02 ENCOUNTER — Ambulatory Visit (HOSPITAL_BASED_OUTPATIENT_CLINIC_OR_DEPARTMENT_OTHER): Payer: 59

## 2014-01-02 ENCOUNTER — Other Ambulatory Visit (HOSPITAL_BASED_OUTPATIENT_CLINIC_OR_DEPARTMENT_OTHER): Payer: 59 | Admitting: Lab

## 2014-01-02 ENCOUNTER — Telehealth: Payer: Self-pay | Admitting: *Deleted

## 2014-01-02 ENCOUNTER — Ambulatory Visit (HOSPITAL_BASED_OUTPATIENT_CLINIC_OR_DEPARTMENT_OTHER): Payer: 59 | Admitting: Hematology & Oncology

## 2014-01-02 ENCOUNTER — Encounter: Payer: Self-pay | Admitting: Hematology & Oncology

## 2014-01-02 VITALS — BP 106/50 | HR 75 | Temp 97.9°F | Resp 14 | Ht 62.0 in | Wt 97.0 lb

## 2014-01-02 DIAGNOSIS — K909 Intestinal malabsorption, unspecified: Secondary | ICD-10-CM

## 2014-01-02 DIAGNOSIS — K50113 Crohn's disease of large intestine with fistula: Secondary | ICD-10-CM

## 2014-01-02 DIAGNOSIS — D510 Vitamin B12 deficiency anemia due to intrinsic factor deficiency: Secondary | ICD-10-CM

## 2014-01-02 DIAGNOSIS — D509 Iron deficiency anemia, unspecified: Secondary | ICD-10-CM

## 2014-01-02 DIAGNOSIS — K5010 Crohn's disease of large intestine without complications: Secondary | ICD-10-CM

## 2014-01-02 LAB — IRON AND TIBC CHCC
%SAT: 23 % (ref 21–57)
IRON: 58 ug/dL (ref 41–142)
TIBC: 249 ug/dL (ref 236–444)
UIBC: 191 ug/dL (ref 120–384)

## 2014-01-02 LAB — CBC WITH DIFFERENTIAL (CANCER CENTER ONLY)
BASO#: 0 10*3/uL (ref 0.0–0.2)
BASO%: 0.3 % (ref 0.0–2.0)
EOS%: 2.5 % (ref 0.0–7.0)
Eosinophils Absolute: 0.3 10*3/uL (ref 0.0–0.5)
HCT: 40.9 % (ref 34.8–46.6)
HEMOGLOBIN: 12.9 g/dL (ref 11.6–15.9)
LYMPH#: 2.2 10*3/uL (ref 0.9–3.3)
LYMPH%: 20.9 % (ref 14.0–48.0)
MCH: 30.4 pg (ref 26.0–34.0)
MCHC: 31.5 g/dL — AB (ref 32.0–36.0)
MCV: 96 fL (ref 81–101)
MONO#: 0.7 10*3/uL (ref 0.1–0.9)
MONO%: 6.3 % (ref 0.0–13.0)
NEUT%: 70 % (ref 39.6–80.0)
NEUTROS ABS: 7.3 10*3/uL — AB (ref 1.5–6.5)
Platelets: 279 10*3/uL (ref 145–400)
RBC: 4.25 10*6/uL (ref 3.70–5.32)
RDW: 14 % (ref 11.1–15.7)
WBC: 10.5 10*3/uL — AB (ref 3.9–10.0)

## 2014-01-02 LAB — CHCC SATELLITE - SMEAR

## 2014-01-02 LAB — FERRITIN CHCC: Ferritin: 1161 ng/ml — ABNORMAL HIGH (ref 9–269)

## 2014-01-02 MED ORDER — CYANOCOBALAMIN 1000 MCG/ML IJ SOLN
1000.0000 ug | Freq: Once | INTRAMUSCULAR | Status: AC
  Administered 2014-01-02: 1000 ug via INTRAMUSCULAR

## 2014-01-02 NOTE — Telephone Encounter (Addendum)
Message copied by Burnett Corrente on Tue Jan 02, 2014  4:20 PM ------      Message from: Josph Macho      Created: Tue Jan 02, 2014 12:26 PM       Call - iron is fine.  pete ------Left voicemail informing pt know iron is okay

## 2014-01-02 NOTE — Progress Notes (Signed)
Hematology and Oncology Follow Up Visit  Denise Lawson [id number] 17-Feb-1956 58 y.o. 01/02/2014   Principle Diagnosis:   Recurrent iron deficiency anemia  Pernicious anemia  Crohn's disease-exacerbation  Current Therapy:    IV iron as indicated  Vitamin B12 1 mg IM every month     Interim History:  Denise Lawson is back for followup. She reports a 30 exasperated Crohn's disease. She does have abdominal pain. She's not having any out his bleeding. She does have a lot of spasms.  Plus give her IV iron back in April.  She is not on systemic B12. She takes orally. She feels very tired. When we last saw her, her B12 level was 320. I suspect that she may need a dose of vitamin B12.  She has a decreased appetite. There is some nausea but no vomiting. She's had no skin rashes. She's had no fever.  Medications: Current outpatient prescriptions:Cyanocobalamin (VITAMIN B 12 PO), Take by mouth every morning., Disp: , Rfl: ;  ergocalciferol (VITAMIN D2) 50000 UNITS capsule, Take 1 capsule (50,000 Units total) by mouth once a week., Disp: 4 capsule, Rfl: 12;  ibuprofen (ADVIL,MOTRIN) 200 MG tablet, Take 400 mg by mouth every 6 (six) hours as needed for pain. , Disp: , Rfl:  oxycodone-acetaminophen (PERCOCET) 2.5-325 MG per tablet, Take 1 tablet by mouth as needed for pain., Disp: , Rfl: ;  prednisoLONE (ORAPRED ODT) 30 MG disintegrating tablet, Take 30 mg by mouth 2 (two) times daily., Disp: , Rfl: ;  Promethazine HCl (PHENERGAN PO), Take by mouth as needed. Dose ?, Disp: , Rfl:   Allergies:  Allergies  Allergen Reactions  . Darvon [Propoxyphene Hcl]     Hives  . Iohexol      Desc: VOMITING-10 YRS AGO @ [facility].     Past Medical History, Surgical history, Social history, and Family History were reviewed and updated.  Review of Systems: As above  Physical Exam:  height is 5' 2"  (1.575 m) and weight is 97 lb (43.999 kg). Her oral temperature is 97.9 F (36.6 C). Her blood  pressure is 106/50 and her pulse is 75. Her respiration is 14.   Thin white female. Her head and exam shows no ocular or oral lesion. She is no palpable cervical or supraclavicular lymph nodes. Lungs are clear. Cardiac exam regular rate and rhythm with no murmurs rubs or bruits. Abdomen is soft. Some tenderness to palpation. She has active bowel sounds. She is no distention. She is no palpable liver or spleen. Extremities shows no clubbing cyanosis or edema. Skin exam no rashes. Neurological exam is non-focal.  Lab Results  Component Value Date   WBC 10.5* 01/02/2014   HGB 12.9 01/02/2014   HCT 40.9 01/02/2014   MCV 96 01/02/2014   PLT 279 01/02/2014     Chemistry      Component Value Date/Time   NA 138 04/15/2013 0825   K 3.6 04/15/2013 0825   CL 104 04/15/2013 0825   CO2 25 04/15/2013 0825   BUN 11 04/15/2013 0825   CREATININE 0.74 04/15/2013 0825      Component Value Date/Time   CALCIUM 9.5 04/15/2013 0825   ALKPHOS 92 05/09/2010 0840   AST 14 05/09/2010 0840   ALT 19 05/09/2010 0840   BILITOT 0.3 05/09/2010 0840         Impression and Plan: Denise Lawson is 54 sexual white female. His Crohn's disease. She is not [place] because of his.  3 we have  to go back on systemic B12. I don't think take it orally is going to help her. This might be why she feels tired.  I will try to "load" her. We'll do B12 weekly for 4 weeks. We'll then go monthly and see if that makes a difference.  I will plan to see her back myself in another 6-8 weeks.   Volanda Napoleon, MD 6/2/20159:45 AM

## 2014-01-03 ENCOUNTER — Telehealth: Payer: Self-pay | Admitting: *Deleted

## 2014-01-03 LAB — BASIC METABOLIC PANEL
BUN: 11 mg/dL (ref 6–23)
CHLORIDE: 104 meq/L (ref 96–112)
CO2: 25 meq/L (ref 19–32)
CREATININE: 0.7 mg/dL (ref 0.50–1.10)
Calcium: 9.2 mg/dL (ref 8.4–10.5)
Glucose, Bld: 97 mg/dL (ref 70–99)
Potassium: 4 mEq/L (ref 3.5–5.3)
Sodium: 140 mEq/L (ref 135–145)

## 2014-01-03 LAB — VITAMIN D 25 HYDROXY (VIT D DEFICIENCY, FRACTURES): VIT D 25 HYDROXY: 26 ng/mL — AB (ref 30–89)

## 2014-01-03 LAB — MAGNESIUM: Magnesium: 1.8 mg/dL (ref 1.5–2.5)

## 2014-01-03 LAB — VITAMIN B12: VITAMIN B 12: 271 pg/mL (ref 211–911)

## 2014-01-03 NOTE — Telephone Encounter (Addendum)
Message copied by Burnett Corrente on Wed Jan 03, 2014  8:36 AM ------      Message from: Arlan Organ R      Created: Wed Jan 03, 2014  6:55 AM       Call - magnesium is ok!!  Cindee Lame ------Informed pt that magnesium is okay.

## 2014-01-11 ENCOUNTER — Other Ambulatory Visit: Payer: Self-pay | Admitting: Nurse Practitioner

## 2014-01-11 ENCOUNTER — Other Ambulatory Visit: Payer: Self-pay | Admitting: *Deleted

## 2014-01-11 DIAGNOSIS — D509 Iron deficiency anemia, unspecified: Secondary | ICD-10-CM

## 2014-01-11 MED ORDER — CYANOCOBALAMIN 1000 MCG/ML IJ SOLN: 1000.0000 ug | INTRAMUSCULAR | Status: DC

## 2014-01-12 ENCOUNTER — Ambulatory Visit (INDEPENDENT_AMBULATORY_CARE_PROVIDER_SITE_OTHER): Payer: 59 | Admitting: Surgery

## 2014-01-12 ENCOUNTER — Ambulatory Visit: Payer: 59

## 2014-01-12 ENCOUNTER — Encounter (INDEPENDENT_AMBULATORY_CARE_PROVIDER_SITE_OTHER): Payer: Self-pay | Admitting: Surgery

## 2014-01-12 ENCOUNTER — Other Ambulatory Visit (INDEPENDENT_AMBULATORY_CARE_PROVIDER_SITE_OTHER): Payer: Self-pay | Admitting: Surgery

## 2014-01-12 VITALS — BP 100/60 | HR 73 | Temp 98.7°F | Resp 16 | Ht 62.0 in | Wt 99.8 lb

## 2014-01-12 DIAGNOSIS — L88 Pyoderma gangrenosum: Secondary | ICD-10-CM | POA: Insufficient documentation

## 2014-01-12 DIAGNOSIS — L02419 Cutaneous abscess of limb, unspecified: Secondary | ICD-10-CM

## 2014-01-12 DIAGNOSIS — L03119 Cellulitis of unspecified part of limb: Principal | ICD-10-CM

## 2014-01-12 NOTE — Patient Instructions (Signed)
Take prescription for doxycycline as instructed.  Change external gauze dressing tomorrow.  Remove dressing in 2 days and remove packing. Cleansed wound in shower. Cover with dry gauze dressing until no further drainage.  Earnstine Regal, MD, [facility] Office: [phone]

## 2014-01-12 NOTE — Progress Notes (Signed)
General Surgery [facility]  Chief Complaint  Patient presents with  . Follow-up    recurrent abscess on calf - patient of Dr. Excell Seltzer    HISTORY: Patient is a 58 year old female with a history of recurrent cutaneous abscesses. She also has Crohn's disease and recently started taking prednisone. She has developed a small cutaneous abscess with associated cellulitis on the posterior aspect of the right calf. She is referred by her primary care provider for evaluation and incision and drainage today.  Patient is not on antibiotics.  PERTINENT REVIEW OF SYSTEMS: Increasing redness, tenderness, and fluctuance on the posterior aspect of the right calf.  EXAM: HEENT: normocephalic; pupils equal and reactive; sclerae clear; dentition good; mucous membranes moist CHEST: clear to auscultation bilaterally without rales, rhonchi, or wheezes CARDIAC: regular rate and rhythm without significant murmur; peripheral pulses are full EXT:  Erythematous, indurated area in the mid posterior right calf; previous surgical scars are noted. Moderate tenderness. NEURO: no gross focal deficits; no sign of tremor   IMPRESSION: Under aseptic conditions using local anesthesia, a 1 cm incision is made into the roof of the abscess cavity. A small volume of purulent fluid is evacuated and captured for culture. This is submitted to the laboratory. A quarter-inch iodoform gauze packing is placed into the wound. Dry gauze dressing is placed.  IMPRESSION: Cellulitis and small abscess, posterior right calf  PLAN: Usual instructions for wound care given. Prescription for doxycycline is called to the patient's pharmacy. Culture results will be forwarded to the patient's primary care provider early next week.  Patient will return for surgical care as needed.  Earnstine Regal, MD, [facility] Office: [phone]  Visit Diagnoses: 1. Cellulitis and abscess of leg-right

## 2014-01-15 ENCOUNTER — Telehealth (INDEPENDENT_AMBULATORY_CARE_PROVIDER_SITE_OTHER): Payer: Self-pay

## 2014-01-15 ENCOUNTER — Other Ambulatory Visit (INDEPENDENT_AMBULATORY_CARE_PROVIDER_SITE_OTHER): Payer: Self-pay

## 2014-01-15 LAB — WOUND CULTURE
GRAM STAIN: NONE SEEN
Gram Stain: NONE SEEN
Gram Stain: NONE SEEN
Organism ID, Bacteria: NO GROWTH

## 2014-01-15 NOTE — Telephone Encounter (Signed)
Pt s/p recurrent abscess on calf. Pt saw Dr Gerrit Friends on 01/12/14. Dr Gerrit Friends had told pt that he would call a Rx for Doxycycline in to pharmacy. Pt states that pharmacy has not gotten Rx at this time. Spoke with Dr Michaell Cowing can call in Doxycycline 100mg  BID #20. Rx called into [facility]. Pt made aware and verbalized understanding.

## 2014-01-19 ENCOUNTER — Ambulatory Visit: Payer: 59

## 2014-01-26 ENCOUNTER — Ambulatory Visit: Payer: 59

## 2014-02-12 ENCOUNTER — Other Ambulatory Visit: Payer: Self-pay | Admitting: *Deleted

## 2014-02-12 DIAGNOSIS — D509 Iron deficiency anemia, unspecified: Secondary | ICD-10-CM

## 2014-02-15 ENCOUNTER — Encounter: Payer: Self-pay | Admitting: Family

## 2014-02-15 ENCOUNTER — Ambulatory Visit: Payer: 59

## 2014-02-15 ENCOUNTER — Other Ambulatory Visit: Payer: Self-pay | Admitting: *Deleted

## 2014-02-15 ENCOUNTER — Ambulatory Visit (HOSPITAL_BASED_OUTPATIENT_CLINIC_OR_DEPARTMENT_OTHER): Payer: 59 | Admitting: Family

## 2014-02-15 ENCOUNTER — Other Ambulatory Visit (HOSPITAL_BASED_OUTPATIENT_CLINIC_OR_DEPARTMENT_OTHER): Payer: 59 | Admitting: Lab

## 2014-02-15 VITALS — BP 99/56 | HR 63 | Temp 98.0°F | Resp 14 | Ht 62.0 in | Wt 99.0 lb

## 2014-02-15 DIAGNOSIS — D509 Iron deficiency anemia, unspecified: Secondary | ICD-10-CM

## 2014-02-15 DIAGNOSIS — K5090 Crohn's disease, unspecified, without complications: Secondary | ICD-10-CM

## 2014-02-15 DIAGNOSIS — D510 Vitamin B12 deficiency anemia due to intrinsic factor deficiency: Secondary | ICD-10-CM

## 2014-02-15 LAB — IRON AND TIBC CHCC
%SAT: 28 % (ref 21–57)
Iron: 75 ug/dL (ref 41–142)
TIBC: 270 ug/dL (ref 236–444)
UIBC: 195 ug/dL (ref 120–384)

## 2014-02-15 LAB — CBC WITH DIFFERENTIAL (CANCER CENTER ONLY)
BASO#: 0 10*3/uL (ref 0.0–0.2)
BASO%: 0.3 % (ref 0.0–2.0)
EOS ABS: 0.3 10*3/uL (ref 0.0–0.5)
EOS%: 3.5 % (ref 0.0–7.0)
HCT: 41.1 % (ref 34.8–46.6)
HEMOGLOBIN: 13 g/dL (ref 11.6–15.9)
LYMPH#: 2.1 10*3/uL (ref 0.9–3.3)
LYMPH%: 22.6 % (ref 14.0–48.0)
MCH: 30.8 pg (ref 26.0–34.0)
MCHC: 31.6 g/dL — ABNORMAL LOW (ref 32.0–36.0)
MCV: 97 fL (ref 81–101)
MONO#: 0.6 10*3/uL (ref 0.1–0.9)
MONO%: 6.3 % (ref 0.0–13.0)
NEUT%: 67.3 % (ref 39.6–80.0)
NEUTROS ABS: 6.1 10*3/uL (ref 1.5–6.5)
Platelets: 257 10*3/uL (ref 145–400)
RBC: 4.22 10*6/uL (ref 3.70–5.32)
RDW: 13.9 % (ref 11.1–15.7)
WBC: 9.1 10*3/uL (ref 3.9–10.0)

## 2014-02-15 LAB — CHCC SATELLITE - SMEAR

## 2014-02-15 LAB — FERRITIN CHCC: FERRITIN: 852 ng/mL — AB (ref 9–269)

## 2014-02-15 MED ORDER — CYANOCOBALAMIN 1000 MCG/ML IJ SOLN: 1000.0000 ug | INTRAMUSCULAR | Status: DC

## 2014-02-15 NOTE — Progress Notes (Signed)
[facility]  Telephone:([phone]) [phone] Fax:(336) [phone]  ID: Denise Lawson OB: 03/22/1956 MR#: [id number] ZHY#:[id number]#:[id number] Patient Care Team: Teena IraniSara C Spencer, PA-C as PCP - General (Physician Assistant)  DIAGNOSIS: Recurrent iron deficiency anemia  Pernicious anemia  Crohn's disease-exacerbation  INTERVAL HISTORY: Mrs. Denise Lawson is a very pleasant lady here alone today for a 1 month followup. She reports a 58 exasperated Crohn's disease. She states that she is feeling much better and that her energy level is good. She denies fever, chills, N/V, headaches, dizziness, blurred vision, rash, cough, SOB, chest pain, palpitations, abdominal pain, constipation, problems urinating, blood in urine or stool. She does have chronic diarrhea and low appetite with her crohn's disease. She last had iron in April and that seems to have worked well for her. She gives herself vit B12 injections once a week and states that these have helped increase her energy level. She is still have leg cramps at night and states that her GI Dr. Delice Lawson her K+ and Mag and they were normal. We are unsure at this time as to what may be causing this.   CURRENT TREATMENT: IV iron as indicated  Vitamin B12 1 mg IM every month (self injects)  REVIEW OF SYSTEMS: All other 10 point review of systems is negative except for those issues mentioned above.   PAST MEDICAL HISTORY: Past Medical History  Diagnosis Date  . Blood transfusion without reported diagnosis   . PONV (postoperative nausea and vomiting)   . Anemia     iron deficiency requiring iron transfusions every 8 months  . Peripheral vascular disease 1980    dvt- left  . Vaginal fistula   . Pericarditis     states years ago/ no follow up in years  . Crohn's disease   . Iron deficiency anemia, unspecified 11/13/2013  . Pernicious anemia 11/13/2013  . Malabsorption of iron 11/13/2013  . Crohn's disease of colon with fistula 11/13/2013   PAST SURGICAL  HISTORY: Past Surgical History  Procedure Laterality Date  . Abdominal hysterectomy    . Bowel resection    . Incision and drainage perirectal abscess    . Appendectomy    . Breast surgery Bilateral     augumentation  . Incision and drainage abscess Right 04/15/2013    Procedure: INCISION AND DRAINAGE AND DEBRIDEMENT RIGHT CALF ABSCESS;  Surgeon: Mariella SaaBenjamin T Hoxworth, MD;  Location: [facility];  Service: General;  Laterality: Right;   FAMILY HISTORY Family History  Problem Relation Age of Onset  . Heart disease Father    GYNECOLOGIC HISTORY:  No LMP recorded. Patient has had a hysterectomy.   SOCIAL HISTORY:  History   Social History  . Marital Status: Married    Spouse Name: N/A    Number of Children: N/A  . Years of Education: N/A   Occupational History  . Not on file.   Social History Main Topics  . Smoking status: Former Smoker -- 0.50 packs/day for 10 years    Types: Cigarettes    Start date: 07/11/1969    Quit date: 08/03/1978  . Smokeless tobacco: Never Used     Comment: quit 30 years ago  . Alcohol Use: No  . Drug Use: No  . Sexual Activity: Not on file   Other Topics Concern  . Not on file   Social History Narrative  . No narrative on file   ADVANCED DIRECTIVES: <no information>  HEALTH MAINTENANCE: History  Substance Use Topics  . Smoking status: Former Smoker --  0.50 packs/day for 10 years    Types: Cigarettes    Start date: 07/11/1969    Quit date: 08/03/1978  . Smokeless tobacco: Never Used     Comment: quit 30 years ago  . Alcohol Use: No   Colonoscopy: PAP: Bone density: Lipid panel:  Allergies  Allergen Reactions  . Darvon [Propoxyphene Hcl]     Hives  . Iohexol      Desc: VOMITING-10 YRS AGO [facility].     Current Outpatient Prescriptions  Medication Sig Dispense Refill  . ergocalciferol (VITAMIN D2) 50000 UNITS capsule Take 1 capsule (50,000 Units total) by mouth once a week.  4 capsule  12  . ibuprofen (ADVIL,MOTRIN)  200 MG tablet Take 400 mg by mouth every 6 (six) hours as needed for pain.       . cyanocobalamin (,VITAMIN B-12,) 1000 MCG/ML injection Inject 1 mL (1,000 mcg total) into the muscle once a week. Every Friday (For 3 Months)  1 mL  18  . Promethazine HCl (PHENERGAN PO) Take by mouth as needed. Dose ?       No current facility-administered medications for this visit.   OBJECTIVE: Filed Vitals:   02/15/14 0900  BP: 99/56  Pulse: 63  Temp: 98 F (36.7 C)  Resp: 14   Body mass index is 18.1 kg/(m^2). ECOG FS:0 - Asymptomatic Ocular: Sclerae unicteric, pupils equal, round and reactive to light Ear-nose-throat: Oropharynx clear, dentition fair Lymphatic: No cervical or supraclavicular adenopathy Lungs no rales or rhonchi, good excursion bilaterally Heart regular rate and rhythm, no murmur appreciated Abd soft, nontender, positive bowel sounds MSK no focal spinal tenderness, no joint edema Neuro: non-focal, well-oriented, appropriate affect Breasts: Deferred  LAB RESULTS:  No results found for this basename: SPEP, UPEP,  kappa and lambda light chains   Lab Results  Component Value Date   WBC 9.1 02/15/2014   NEUTROABS 6.1 02/15/2014   HGB 13.0 02/15/2014   HCT 41.1 02/15/2014   MCV 97 02/15/2014   PLT 257 02/15/2014   No results found for this basename: LABCA2   No components found with this basename: LABCA125   No results found for this basename: INR,  in the last 168 hours Urinalysis No results found for this basename: colorurine, appearanceur, labspec, phurine, glucoseu, hgbur, bilirubinur, ketonesur, proteinur, urobilinogen, nitrite, leukocytesur   STUDIES: No results found.  ASSESSMENT/PLAN:  Ms. Lawson is 58 yo white female with Crohn's disease, pernicious and iron deficiency anemia. She is completely asymptomatic at this time.   She will continue to give herself Vitamin B12 1,023mcg injections weekly. A new prerscription was sent to her pharmacy.   We discussed her CBC,  which was negative. All other labs are pending. We will call her if iron studies are low and she needs iron infusion.   We will see her back in 3 months for labs and a follow-up appointment.   All questions were answered and she is in agreement with the plan.   She knows to call here if she has any questions or concerns and to go to the ED in the event of an emergency. We can certainly see her back sooner if need be.   Verdie Mosher, NP 02/15/2014 10:05 AM

## 2014-02-16 ENCOUNTER — Telehealth: Payer: Self-pay | Admitting: *Deleted

## 2014-02-16 LAB — BASIC METABOLIC PANEL
BUN: 9 mg/dL (ref 6–23)
CHLORIDE: 106 meq/L (ref 96–112)
CO2: 27 mEq/L (ref 19–32)
Calcium: 9.4 mg/dL (ref 8.4–10.5)
Creatinine, Ser: 0.76 mg/dL (ref 0.50–1.10)
GLUCOSE: 95 mg/dL (ref 70–99)
Potassium: 4.2 mEq/L (ref 3.5–5.3)
SODIUM: 139 meq/L (ref 135–145)

## 2014-02-16 LAB — VITAMIN B12: Vitamin B-12: 317 pg/mL (ref 211–911)

## 2014-02-16 LAB — MAGNESIUM: MAGNESIUM: 1.9 mg/dL (ref 1.5–2.5)

## 2014-02-16 LAB — VITAMIN D 25 HYDROXY (VIT D DEFICIENCY, FRACTURES): Vit D, 25-Hydroxy: 32 ng/mL (ref 30–89)

## 2014-02-16 NOTE — Telephone Encounter (Addendum)
Message copied by Burnett Corrente on Fri Feb 16, 2014 10:35 AM ------      Message from: Arlan Organ R      Created: Thu Feb 15, 2014  5:18 PM       Call - b12 and iron are ok. Magnesium is ok.  pete ------Informed pt that B12, iron, and magnesium are okay.

## 2014-02-16 NOTE — Telephone Encounter (Addendum)
Message copied by Burnett CorrenteUMLEY, Trianna Lupien L on Fri Feb 16, 2014 12:45 PM ------      Message from: Josph MachoENNEVER, PETER R      Created: Fri Feb 16, 2014 11:24 AM       Call - vit D is better !!pete ------Left voicemail informing pt that vit D is better

## 2014-03-22 DIAGNOSIS — N828 Other female genital tract fistulae: Secondary | ICD-10-CM | POA: Insufficient documentation

## 2014-04-19 DIAGNOSIS — N390 Urinary tract infection, site not specified: Secondary | ICD-10-CM | POA: Insufficient documentation

## 2014-05-17 ENCOUNTER — Encounter: Payer: Self-pay | Admitting: Family

## 2014-05-17 ENCOUNTER — Other Ambulatory Visit (HOSPITAL_BASED_OUTPATIENT_CLINIC_OR_DEPARTMENT_OTHER): Payer: 59 | Admitting: Lab

## 2014-05-17 ENCOUNTER — Ambulatory Visit (HOSPITAL_BASED_OUTPATIENT_CLINIC_OR_DEPARTMENT_OTHER): Payer: 59 | Admitting: Family

## 2014-05-17 VITALS — BP 100/58 | HR 73 | Temp 98.1°F | Resp 18 | Wt 106.0 lb

## 2014-05-17 DIAGNOSIS — K5090 Crohn's disease, unspecified, without complications: Secondary | ICD-10-CM

## 2014-05-17 DIAGNOSIS — D510 Vitamin B12 deficiency anemia due to intrinsic factor deficiency: Secondary | ICD-10-CM

## 2014-05-17 DIAGNOSIS — D509 Iron deficiency anemia, unspecified: Secondary | ICD-10-CM

## 2014-05-17 LAB — CBC WITH DIFFERENTIAL (CANCER CENTER ONLY)
BASO#: 0 10*3/uL (ref 0.0–0.2)
BASO%: 0.2 % (ref 0.0–2.0)
EOS%: 3.8 % (ref 0.0–7.0)
Eosinophils Absolute: 0.3 10*3/uL (ref 0.0–0.5)
HCT: 39 % (ref 34.8–46.6)
HGB: 12.6 g/dL (ref 11.6–15.9)
LYMPH#: 1.8 10*3/uL (ref 0.9–3.3)
LYMPH%: 21.1 % (ref 14.0–48.0)
MCH: 31.3 pg (ref 26.0–34.0)
MCHC: 32.3 g/dL (ref 32.0–36.0)
MCV: 97 fL (ref 81–101)
MONO#: 0.5 10*3/uL (ref 0.1–0.9)
MONO%: 5.8 % (ref 0.0–13.0)
NEUT%: 69.1 % (ref 39.6–80.0)
NEUTROS ABS: 6 10*3/uL (ref 1.5–6.5)
Platelets: 244 10*3/uL (ref 145–400)
RBC: 4.02 10*6/uL (ref 3.70–5.32)
RDW: 13.2 % (ref 11.1–15.7)
WBC: 8.7 10*3/uL (ref 3.9–10.0)

## 2014-05-17 LAB — IRON AND TIBC CHCC
%SAT: 27 % (ref 21–57)
Iron: 69 ug/dL (ref 41–142)
TIBC: 254 ug/dL (ref 236–444)
UIBC: 185 ug/dL (ref 120–384)

## 2014-05-17 LAB — FERRITIN CHCC: Ferritin: 736 ng/ml — ABNORMAL HIGH (ref 9–269)

## 2014-05-17 LAB — CHCC SATELLITE - SMEAR

## 2014-05-17 NOTE — Progress Notes (Signed)
[place]  [phone] [phone]  ID: Denise Lawson OB: 1955-11-28 MR#: [id number] YKD#:[id number] Patient Care Team: Aura Dials, PA-C as PCP - General (Physician Assistant)  DIAGNOSIS: Recurrent iron deficiency anemia  Pernicious anemia  Crohn's disease-exacerbation  INTERVAL HISTORY: Denise Lawson is here today for a follow-up. She is having a lot of problems with her Crohn's disease. She has applied for disability because of this. She denies fever, chills, N/V, headaches, dizziness, blurred vision, rash, cough, SOB, chest pain, palpitations, abdominal pain, constipation, problems urinating, blood in urine or stool. She does have chronic diarrhea and low appetite with her crohn's disease. She last had iron in April and has done well with it. She gives herself vit B12 injections once a week and states that these have helped increase her energy level. Her appetite is ok and she has gained back a little weight. She is staying hydrated.    CURRENT TREATMENT: IV iron as indicated  Vitamin B12 1 mg IM weekly (self injects)  REVIEW OF SYSTEMS: All other 10 point review of systems is negative except for those issues mentioned above.   PAST MEDICAL HISTORY: Past Medical History  Diagnosis Date  . Blood transfusion without reported diagnosis   . PONV (postoperative nausea and vomiting)   . Anemia     iron deficiency requiring iron transfusions every 8 months  . Peripheral vascular disease 1980    dvt- left  . Vaginal fistula   . Pericarditis     states years ago/ no follow up in years  . Crohn's disease   . Iron deficiency anemia, unspecified 11/13/2013  . Pernicious anemia 11/13/2013  . Malabsorption of iron 11/13/2013  . Crohn's disease of colon with fistula 11/13/2013   PAST SURGICAL HISTORY: Past Surgical History  Procedure Laterality Date  . Abdominal hysterectomy    . Bowel resection    . Incision and drainage perirectal abscess    .  Appendectomy    . Breast surgery Bilateral     augumentation  . Incision and drainage abscess Right 04/15/2013    Procedure: INCISION AND DRAINAGE AND DEBRIDEMENT RIGHT CALF ABSCESS;  Surgeon: Edward Jolly, MD;  Location: [facility];  Service: General;  Laterality: Right;   FAMILY HISTORY Family History  Problem Relation Age of Onset  . Heart disease Father    GYNECOLOGIC HISTORY:  No LMP recorded. Patient has had a hysterectomy.   SOCIAL HISTORY:  History   Social History  . Marital Status: Married    Spouse Name: N/A    Number of Children: N/A  . Years of Education: N/A   Occupational History  . Not on file.   Social History Main Topics  . Smoking status: Former Smoker -- 0.50 packs/day for 10 years    Types: Cigarettes    Start date: 07/11/1969    Quit date: 08/03/1978  . Smokeless tobacco: Never Used     Comment: quit 30 years ago  . Alcohol Use: No  . Drug Use: No  . Sexual Activity: Not on file   Other Topics Concern  . Not on file   Social History Narrative  . No narrative on file   ADVANCED DIRECTIVES: <no information>  HEALTH MAINTENANCE: History  Substance Use Topics  . Smoking status: Former Smoker -- 0.50 packs/day for 10 years    Types: Cigarettes    Start date: 07/11/1969    Quit date: 08/03/1978  . Smokeless tobacco: Never Used  Comment: quit 30 years ago  . Alcohol Use: No   Colonoscopy: PAP: Bone density: Lipid panel:  Allergies  Allergen Reactions  . Darvon [Propoxyphene Hcl]     Hives  . Iohexol      Desc: VOMITING-10 YRS AGO @ [facility].     Current Outpatient Prescriptions  Medication Sig Dispense Refill  . cyanocobalamin (,VITAMIN B-12,) 1000 MCG/ML injection Inject 1 mL (1,000 mcg total) into the muscle once a week. Every Friday (For 3 Months)  1 mL  18  . ergocalciferol (VITAMIN D2) 50000 UNITS capsule Take 1 capsule (50,000 Units total) by mouth once a week.  4 capsule  12  . ibuprofen (ADVIL,MOTRIN) 200 MG  tablet Take 400 mg by mouth every 6 (six) hours as needed for pain.       . Promethazine HCl (PHENERGAN PO) Take by mouth as needed. Dose ?       No current facility-administered medications for this visit.   OBJECTIVE: Filed Vitals:   05/17/14 0915  BP: 100/58  Pulse: 73  Temp: 98.1 F (36.7 C)  Resp: 18   Body mass index is 19.38 kg/(m^2). ECOG FS:0 - Asymptomatic Ocular: Sclerae unicteric, pupils equal, round and reactive to light Ear-nose-throat: Oropharynx clear, dentition fair Lymphatic: No cervical or supraclavicular adenopathy Lungs no rales or rhonchi, good excursion bilaterally Heart regular rate and rhythm, no murmur appreciated Abd soft, nontender, positive bowel sounds MSK no focal spinal tenderness, no joint edema Neuro: non-focal, well-oriented, appropriate affect Breasts: Deferred  LAB RESULTS:  No results found for this basename: SPEP,  UPEP,   kappa and lambda light chains   Lab Results  Component Value Date   WBC 8.7 05/17/2014   NEUTROABS 6.0 05/17/2014   HGB 12.6 05/17/2014   HCT 39.0 05/17/2014   MCV 97 05/17/2014   PLT 244 05/17/2014   No results found for this basename: LABCA2   No components found with this basename: LABCA125   No results found for this basename: INR,  in the last 168 hours Urinalysis No results found for this basename: colorurine,  appearanceur,  labspec,  phurine,  glucoseu,  hgbur,  bilirubinur,  ketonesur,  proteinur,  urobilinogen,  nitrite,  leukocytesur   STUDIES: No results found.  ASSESSMENT/PLAN:  Denise Lawson is 58 yo white female with Crohn's disease, pernicious and iron deficiency anemia. She is completely asymptomatic at this time.   She will continue to give herself Vitamin B12 1,036mg injections weekly. A new prerscription was sent to her pharmacy.   We discussed her CBC, which was negative. All other labs are pending. We will call her if iron studies are low and she needs iron infusion.   We will see her  back in 3 months for labs and a follow-up appointment.   All questions were answered and she is in agreement with the plan.   She knows to call here if she has any questions or concerns and to go to the ED in the event of an emergency. We can certainly see her back sooner if need be.   CEliezer Bottom NP 05/17/2014 10:01 AM

## 2014-05-18 LAB — VITAMIN B12: Vitamin B-12: 349 pg/mL (ref 211–911)

## 2014-05-18 LAB — BASIC METABOLIC PANEL
BUN: 12 mg/dL (ref 6–23)
CHLORIDE: 104 meq/L (ref 96–112)
CO2: 27 meq/L (ref 19–32)
CREATININE: 0.72 mg/dL (ref 0.50–1.10)
Calcium: 9.4 mg/dL (ref 8.4–10.5)
GLUCOSE: 92 mg/dL (ref 70–99)
Potassium: 3.7 mEq/L (ref 3.5–5.3)
Sodium: 141 mEq/L (ref 135–145)

## 2014-05-18 LAB — VITAMIN D 25 HYDROXY (VIT D DEFICIENCY, FRACTURES): Vit D, 25-Hydroxy: 31 ng/mL (ref 30–89)

## 2014-05-18 LAB — MAGNESIUM: Magnesium: 1.7 mg/dL (ref 1.5–2.5)

## 2014-08-17 ENCOUNTER — Ambulatory Visit (HOSPITAL_BASED_OUTPATIENT_CLINIC_OR_DEPARTMENT_OTHER): Payer: 59 | Admitting: Hematology & Oncology

## 2014-08-17 ENCOUNTER — Other Ambulatory Visit (HOSPITAL_BASED_OUTPATIENT_CLINIC_OR_DEPARTMENT_OTHER): Payer: 59 | Admitting: Lab

## 2014-08-17 ENCOUNTER — Encounter: Payer: Self-pay | Admitting: Hematology & Oncology

## 2014-08-17 VITALS — BP 112/57 | HR 62 | Temp 98.3°F | Resp 14 | Ht 62.0 in | Wt 107.0 lb

## 2014-08-17 DIAGNOSIS — D510 Vitamin B12 deficiency anemia due to intrinsic factor deficiency: Secondary | ICD-10-CM

## 2014-08-17 DIAGNOSIS — K50113 Crohn's disease of large intestine with fistula: Secondary | ICD-10-CM

## 2014-08-17 DIAGNOSIS — D509 Iron deficiency anemia, unspecified: Secondary | ICD-10-CM

## 2014-08-17 DIAGNOSIS — K5090 Crohn's disease, unspecified, without complications: Secondary | ICD-10-CM

## 2014-08-17 DIAGNOSIS — K909 Intestinal malabsorption, unspecified: Secondary | ICD-10-CM

## 2014-08-17 LAB — CBC WITH DIFFERENTIAL (CANCER CENTER ONLY)
BASO#: 0 10*3/uL (ref 0.0–0.2)
BASO%: 0.1 % (ref 0.0–2.0)
EOS ABS: 0.3 10*3/uL (ref 0.0–0.5)
EOS%: 2.5 % (ref 0.0–7.0)
HEMATOCRIT: 39 % (ref 34.8–46.6)
HGB: 12.3 g/dL (ref 11.6–15.9)
LYMPH#: 2.2 10*3/uL (ref 0.9–3.3)
LYMPH%: 22 % (ref 14.0–48.0)
MCH: 29.9 pg (ref 26.0–34.0)
MCHC: 31.5 g/dL — AB (ref 32.0–36.0)
MCV: 95 fL (ref 81–101)
MONO#: 0.6 10*3/uL (ref 0.1–0.9)
MONO%: 6 % (ref 0.0–13.0)
NEUT#: 6.9 10*3/uL — ABNORMAL HIGH (ref 1.5–6.5)
NEUT%: 69.4 % (ref 39.6–80.0)
Platelets: 307 10*3/uL (ref 145–400)
RBC: 4.11 10*6/uL (ref 3.70–5.32)
RDW: 13.1 % (ref 11.1–15.7)
WBC: 9.9 10*3/uL (ref 3.9–10.0)

## 2014-08-17 LAB — IRON AND TIBC CHCC
%SAT: 17 % — ABNORMAL LOW (ref 21–57)
IRON: 46 ug/dL (ref 41–142)
TIBC: 269 ug/dL (ref 236–444)
UIBC: 223 ug/dL (ref 120–384)

## 2014-08-17 LAB — FERRITIN CHCC: Ferritin: 727 ng/ml — ABNORMAL HIGH (ref 9–269)

## 2014-08-17 NOTE — Progress Notes (Signed)
Hematology and Oncology Follow Up Visit  VINCENZINA JAGODA [id number] 08/02/1956 59 y.o. 08/17/2014   Principle Diagnosis:   Recurrent iron deficiency anemia  Pernicious anemia  Crohn's disease-exacerbation  Current Therapy:    IV iron as indicated  Vitamin B12 1 mg IM every month     Interim History:  Ms.  Pinkus is back for followup. She reports no flare-ups of Crohn's disease. She does have chronic abdominal pain. She is on prophylactic antibiotics for UTI. She is on Macrobid.  She does give herself vitamin B-12 at home. She is on vitamin D. She does have joint problems.   She has a good appetite. She enjoyed all the holidays.  She is taking care of her mother who has dementia. As such, she is not sleeping all that much.   There is some nausea but no vomiting. She's had no skin rashes. She's had no fever.  Medications:  Current outpatient prescriptions:  .  cyanocobalamin (,VITAMIN B-12,) 1000 MCG/ML injection, Inject 1 mL (1,000 mcg total) into the muscle once a week. Every Friday (For 3 Months), Disp: 1 mL, Rfl: 18 .  ergocalciferol (VITAMIN D2) 50000 UNITS capsule, Take 1 capsule (50,000 Units total) by mouth once a week., Disp: 4 capsule, Rfl: 12 .  ibuprofen (ADVIL,MOTRIN) 200 MG tablet, Take 400 mg by mouth every 6 (six) hours as needed for pain. , Disp: , Rfl:  .  nitrofurantoin (MACRODANTIN) 50 MG capsule, Take 50 mg by mouth at bedtime., Disp: , Rfl:  .  oxyCODONE-acetaminophen (PERCOCET/ROXICET) 5-325 MG per tablet, Take by mouth as needed for severe pain. PT NOT SURE OF DOSE, Disp: , Rfl:  .  Promethazine HCl (PHENERGAN PO), Take 25 mg by mouth as needed. Dose ?, Disp: , Rfl:  .  sertraline (ZOLOFT) 25 MG tablet, Take 25 mg by mouth daily., Disp: , Rfl:   Allergies:  Allergies  Allergen Reactions  . Darvon [Propoxyphene Hcl]     Hives  . Iohexol      Desc: VOMITING-10 YRS AGO @ [facility].     Past Medical History, Surgical history, Social history,  and Family History were reviewed and updated.  Review of Systems: As above  Physical Exam:  height is 5' 2"  (1.575 m) and weight is 107 lb (48.535 kg). Her oral temperature is 98.3 F (36.8 C). Her blood pressure is 112/57 and her pulse is 62. Her respiration is 14.   Thin white female. Her head and exam shows no ocular or oral lesion. She is no palpable cervical or supraclavicular lymph nodes. Lungs are clear. Cardiac exam regular rate and rhythm with no murmurs rubs or bruits. Abdomen is soft. Some tenderness to palpation. She has active bowel sounds. She is no distention. She is no palpable liver or spleen. Extremities shows no clubbing cyanosis or edema. Skin exam no rashes. Neurological exam is non-focal.  Lab Results  Component Value Date   WBC 9.9 08/17/2014   HGB 12.3 08/17/2014   HCT 39.0 08/17/2014   MCV 95 08/17/2014   PLT 307 08/17/2014     Chemistry      Component Value Date/Time   NA 141 05/17/2014 0900   K 3.7 05/17/2014 0900   CL 104 05/17/2014 0900   CO2 27 05/17/2014 0900   BUN 12 05/17/2014 0900   CREATININE 0.72 05/17/2014 0900      Component Value Date/Time   CALCIUM 9.4 05/17/2014 0900   ALKPHOS 92 05/09/2010 0840   AST 14 05/09/2010  0840   ALT 19 05/09/2010 0840   BILITOT 0.3 05/09/2010 0840         Impression and Plan: Ms. Dezeeuw is 59 year old white female. She has Crohn's disease. This has been doing okay. I think that she does not need any iron.  I will plan to see her back myself in another 3 months.   Volanda Napoleon, MD 1/15/201610:51 AM

## 2014-08-18 LAB — BASIC METABOLIC PANEL
BUN: 12 mg/dL (ref 6–23)
CO2: 24 meq/L (ref 19–32)
Calcium: 9.4 mg/dL (ref 8.4–10.5)
Chloride: 103 mEq/L (ref 96–112)
Creatinine, Ser: 0.71 mg/dL (ref 0.50–1.10)
GLUCOSE: 76 mg/dL (ref 70–99)
Potassium: 3.9 mEq/L (ref 3.5–5.3)
Sodium: 137 mEq/L (ref 135–145)

## 2014-08-18 LAB — MAGNESIUM: Magnesium: 2 mg/dL (ref 1.5–2.5)

## 2014-08-18 LAB — VITAMIN D 25 HYDROXY (VIT D DEFICIENCY, FRACTURES): Vit D, 25-Hydroxy: 17 ng/mL — ABNORMAL LOW (ref 30–100)

## 2014-08-21 ENCOUNTER — Other Ambulatory Visit: Payer: Self-pay | Admitting: Family

## 2014-08-21 ENCOUNTER — Other Ambulatory Visit: Payer: Self-pay | Admitting: *Deleted

## 2014-08-21 ENCOUNTER — Telehealth: Payer: Self-pay | Admitting: *Deleted

## 2014-08-21 DIAGNOSIS — K909 Intestinal malabsorption, unspecified: Secondary | ICD-10-CM

## 2014-08-21 NOTE — Telephone Encounter (Addendum)
Left message and sent message to scheduler.  ----- Message from Volanda Napoleon, MD sent at 08/21/2014 11:27 AM EST ----- Call - iron is actually low.  Need feraheme 549m x 1 dose.  Please set up.  Denise Lawson

## 2014-08-22 ENCOUNTER — Telehealth: Payer: Self-pay | Admitting: Hematology & Oncology

## 2014-08-22 NOTE — Telephone Encounter (Signed)
Left pt message on primary number Dr. Marin Olp wants her to get iron this week and to call for appointment. I called home number no answer or voice mail.

## 2014-08-27 ENCOUNTER — Telehealth: Payer: Self-pay | Admitting: Hematology & Oncology

## 2014-08-27 NOTE — Telephone Encounter (Signed)
Patient called and sch iron apt for 08/28/14

## 2014-08-28 ENCOUNTER — Ambulatory Visit (HOSPITAL_BASED_OUTPATIENT_CLINIC_OR_DEPARTMENT_OTHER): Payer: 59

## 2014-08-28 DIAGNOSIS — D509 Iron deficiency anemia, unspecified: Secondary | ICD-10-CM

## 2014-08-28 DIAGNOSIS — K5090 Crohn's disease, unspecified, without complications: Secondary | ICD-10-CM

## 2014-08-28 MED ORDER — SODIUM CHLORIDE 0.9 % IV SOLN
Freq: Once | INTRAVENOUS | Status: AC
  Administered 2014-08-28: 12:00:00 via INTRAVENOUS

## 2014-08-28 MED ORDER — SODIUM CHLORIDE 0.9 % IV SOLN
510.0000 mg | Freq: Once | INTRAVENOUS | Status: AC
  Administered 2014-08-28: 510 mg via INTRAVENOUS
  Filled 2014-08-28: qty 17

## 2014-08-28 NOTE — Patient Instructions (Signed)

## 2014-10-18 ENCOUNTER — Other Ambulatory Visit: Payer: Self-pay | Admitting: Family

## 2014-11-16 ENCOUNTER — Encounter: Payer: Self-pay | Admitting: Family

## 2014-11-16 ENCOUNTER — Other Ambulatory Visit (HOSPITAL_BASED_OUTPATIENT_CLINIC_OR_DEPARTMENT_OTHER): Payer: 59

## 2014-11-16 ENCOUNTER — Ambulatory Visit (HOSPITAL_BASED_OUTPATIENT_CLINIC_OR_DEPARTMENT_OTHER): Payer: 59 | Admitting: Family

## 2014-11-16 VITALS — BP 113/67 | HR 69 | Temp 97.9°F | Resp 14 | Ht 62.0 in | Wt 110.0 lb

## 2014-11-16 DIAGNOSIS — D510 Vitamin B12 deficiency anemia due to intrinsic factor deficiency: Secondary | ICD-10-CM

## 2014-11-16 DIAGNOSIS — K909 Intestinal malabsorption, unspecified: Secondary | ICD-10-CM

## 2014-11-16 DIAGNOSIS — K50113 Crohn's disease of large intestine with fistula: Secondary | ICD-10-CM

## 2014-11-16 DIAGNOSIS — D509 Iron deficiency anemia, unspecified: Secondary | ICD-10-CM

## 2014-11-16 LAB — CBC WITH DIFFERENTIAL (CANCER CENTER ONLY)
BASO#: 0 10*3/uL (ref 0.0–0.2)
BASO%: 0.2 % (ref 0.0–2.0)
EOS%: 2.6 % (ref 0.0–7.0)
Eosinophils Absolute: 0.3 10*3/uL (ref 0.0–0.5)
HEMATOCRIT: 39.4 % (ref 34.8–46.6)
HEMOGLOBIN: 12.6 g/dL (ref 11.6–15.9)
LYMPH#: 2.1 10*3/uL (ref 0.9–3.3)
LYMPH%: 21.1 % (ref 14.0–48.0)
MCH: 30.3 pg (ref 26.0–34.0)
MCHC: 32 g/dL (ref 32.0–36.0)
MCV: 95 fL (ref 81–101)
MONO#: 0.6 10*3/uL (ref 0.1–0.9)
MONO%: 6.2 % (ref 0.0–13.0)
NEUT#: 7 10*3/uL — ABNORMAL HIGH (ref 1.5–6.5)
NEUT%: 69.9 % (ref 39.6–80.0)
Platelets: 267 10*3/uL (ref 145–400)
RBC: 4.16 10*6/uL (ref 3.70–5.32)
RDW: 14 % (ref 11.1–15.7)
WBC: 10 10*3/uL (ref 3.9–10.0)

## 2014-11-16 LAB — COMPREHENSIVE METABOLIC PANEL
ALK PHOS: 82 U/L (ref 39–117)
ALT: 12 U/L (ref 0–35)
AST: 12 U/L (ref 0–37)
Albumin: 3.6 g/dL (ref 3.5–5.2)
BILIRUBIN TOTAL: 0.3 mg/dL (ref 0.2–1.2)
BUN: 13 mg/dL (ref 6–23)
CALCIUM: 9.2 mg/dL (ref 8.4–10.5)
CO2: 22 mEq/L (ref 19–32)
Chloride: 106 mEq/L (ref 96–112)
Creatinine, Ser: 0.66 mg/dL (ref 0.50–1.10)
Glucose, Bld: 91 mg/dL (ref 70–99)
Potassium: 3.8 mEq/L (ref 3.5–5.3)
Sodium: 139 mEq/L (ref 135–145)
Total Protein: 7.1 g/dL (ref 6.0–8.3)

## 2014-11-16 NOTE — Progress Notes (Signed)
Hematology and Oncology Follow Up Visit  Denise Lawson [id number] 1956-05-07 59 y.o. 11/16/2014   Principle Diagnosis:   Recurrent iron deficiency anemia  Pernicious anemia  Crohn's disease-exacerbation  Current Therapy:    IV iron as indicated - last infusion January 2016  Vitamin B12 1 mg IM every month    Interim History:  Denise Lawson is here today for a follow-up. She is feeling much better and has more energy. She received feraheme in January and has done well with it.  She denies fatigue, fever, chills, N/V, headaches, dizziness, blurred vision, rash, cough, SOB, chest pain, palpitations, abdominal pain, constipation, problems urinating, blood in urine or stool. She does have chronic diarrhea and low appetite with her crohn's disease. She has not had a flare in in a few months.  She gives herself vit B12 injections once a week.  Her appetite is ok and she continues to gain weight. She is staying hydrated.   Medications:    Medication List       This list is accurate as of: 11/16/14 10:49 AM.  Always use your most recent med list.               cyanocobalamin 1000 MCG/ML injection  Commonly known as:  (VITAMIN B-12)  Inject 1 mL (1,000 mcg total) into the muscle once a week. Every Friday (For 3 Months)     dimenhyDRINATE 50 MG tablet  Commonly known as:  DRAMAMINE  Take 50 mg by mouth as needed.     ergocalciferol 50000 UNITS capsule  Commonly known as:  VITAMIN D2  Take 1 capsule (50,000 Units total) by mouth once a week.     ibuprofen 200 MG tablet  Commonly known as:  ADVIL,MOTRIN  Take 400 mg by mouth every 6 (six) hours as needed for pain.     nitrofurantoin 50 MG capsule  Commonly known as:  MACRODANTIN  Take 50 mg by mouth at bedtime.     NON FORMULARY  Take by mouth as needed. OTC for diarrhea     NON FORMULARY  Take by mouth every morning. EHT  Herbal supplement     oxyCODONE-acetaminophen 5-325 MG per tablet  Commonly known as:   PERCOCET/ROXICET  Take by mouth as needed for severe pain. PT NOT SURE OF DOSE     PHENERGAN PO  Take 25 mg by mouth as needed. Dose ?        Allergies:  Allergies  Allergen Reactions  . Darvon [Propoxyphene Hcl]     Hives  . Iohexol      Desc: VOMITING-10 YRS AGO @ [facility].     Past Medical History, Surgical history, Social history, and Family History were reviewed and updated.  Review of Systems: All other 10 point review of systems is negative.   Physical Exam:  height is 5' 2"  (1.575 m) and weight is 110 lb (49.896 kg). Her oral temperature is 97.9 F (36.6 C). Her blood pressure is 113/67 and her pulse is 69. Her respiration is 14.   Wt Readings from Last 3 Encounters:  11/16/14 110 lb (49.896 kg)  08/17/14 107 lb (48.535 kg)  05/17/14 106 lb (48.081 kg)    Ocular: Sclerae unicteric, pupils equal, round and reactive to light Ear-nose-throat: Oropharynx clear, dentition fair Lymphatic: No cervical or supraclavicular adenopathy Lungs no rales or rhonchi, good excursion bilaterally Heart regular rate and rhythm, no murmur appreciated Abd soft, nontender, positive bowel sounds MSK no focal spinal tenderness, no  joint edema Neuro: non-focal, well-oriented, appropriate affect Breasts: Deferred  Lab Results  Component Value Date   WBC 10.0 11/16/2014   HGB 12.6 11/16/2014   HCT 39.4 11/16/2014   MCV 95 11/16/2014   PLT 267 11/16/2014   Lab Results  Component Value Date   FERRITIN 727* 08/17/2014   IRON 46 08/17/2014   TIBC 269 08/17/2014   UIBC 223 08/17/2014   IRONPCTSAT 17* 08/17/2014   Lab Results  Component Value Date   RETICCTPCT 1.2 12/25/2011   RBC 4.16 11/16/2014   RETICCTABS 49.8 12/25/2011   No results found for: KPAFRELGTCHN, LAMBDASER, KAPLAMBRATIO No results found for: IGGSERUM, IGA, IGMSERUM No results found for: Ronnald Ramp, A1GS, A2GS, Tillman Sers, SPEI   Chemistry      Component Value  Date/Time   NA 137 08/17/2014 0935   K 3.9 08/17/2014 0935   CL 103 08/17/2014 0935   CO2 24 08/17/2014 0935   BUN 12 08/17/2014 0935   CREATININE 0.71 08/17/2014 0935      Component Value Date/Time   CALCIUM 9.4 08/17/2014 0935   ALKPHOS 92 05/09/2010 0840   AST 14 05/09/2010 0840   ALT 19 05/09/2010 0840   BILITOT 0.3 05/09/2010 0840     Impression and Plan: Denise Lawson is 59 yo white female with Crohn's disease, pernicious and iron deficiency anemia. She is asymptomatic at this time and doing well.  We will see what her iron studies show. I do not think she needs Feraheme at this time.  She will continue to give herself Vitamin B12 1,041mg injections weekly.  We will see her back in 3 months for labs and follow-up.  She knows to call here if she has any questions or concerns and to go to the ED in the event of an emergency. We can certainly see her back sooner if need be.   CEliezer Bottom NP 4/15/201610:49 AM

## 2014-11-17 LAB — FERRITIN: FERRITIN: 992 ng/mL — AB (ref 10–291)

## 2014-11-17 LAB — IRON AND TIBC
%SAT: 19 % — ABNORMAL LOW (ref 20–55)
IRON: 53 ug/dL (ref 42–145)
TIBC: 280 ug/dL (ref 250–470)
UIBC: 227 ug/dL (ref 125–400)

## 2014-11-17 LAB — VITAMIN B12: Vitamin B-12: 265 pg/mL (ref 211–911)

## 2014-11-17 LAB — VITAMIN D 25 HYDROXY (VIT D DEFICIENCY, FRACTURES): VIT D 25 HYDROXY: 17 ng/mL — AB (ref 30–100)

## 2014-11-20 ENCOUNTER — Other Ambulatory Visit: Payer: Self-pay | Admitting: *Deleted

## 2014-11-20 ENCOUNTER — Telehealth: Payer: Self-pay | Admitting: *Deleted

## 2014-11-20 DIAGNOSIS — D510 Vitamin B12 deficiency anemia due to intrinsic factor deficiency: Secondary | ICD-10-CM

## 2014-11-20 NOTE — Telephone Encounter (Addendum)
Patient aware of results. Appointment scheduled.   ----- Message from Volanda Napoleon, MD sent at 11/19/2014  4:15 PM EDT ----- Call - iron is borderline low.  Need feraheme 558m x 1 dose.  Please set up in 1-2 week.  pete

## 2014-11-26 ENCOUNTER — Telehealth: Payer: Self-pay | Admitting: Hematology & Oncology

## 2014-11-26 NOTE — Telephone Encounter (Signed)
Received 2 messages on Friday 1st was to change tues iron to latter in the day and the 2nd was to leave appointment the same. I was out Friday and did not call pt back Monday after hearing 2 nd message

## 2014-11-27 ENCOUNTER — Ambulatory Visit: Payer: 59

## 2014-11-27 VITALS — BP 111/68 | HR 84 | Temp 98.4°F | Resp 20

## 2014-11-27 DIAGNOSIS — D510 Vitamin B12 deficiency anemia due to intrinsic factor deficiency: Secondary | ICD-10-CM

## 2014-11-27 MED ORDER — SODIUM CHLORIDE 0.9 % IV SOLN
510.0000 mg | Freq: Once | INTRAVENOUS | Status: DC
  Filled 2014-11-27: qty 17

## 2014-11-27 NOTE — Progress Notes (Signed)
1340 Pt here today for iron infusion, after 5 IV attempts, will reschedule infusion for next week. Encouraged pt to drink water and eat breakfast before next appointment. Verbalized understanding.

## 2014-12-04 ENCOUNTER — Ambulatory Visit (HOSPITAL_BASED_OUTPATIENT_CLINIC_OR_DEPARTMENT_OTHER): Payer: 59

## 2014-12-04 ENCOUNTER — Other Ambulatory Visit: Payer: Self-pay | Admitting: Emergency Medicine

## 2014-12-04 VITALS — BP 107/59 | HR 80 | Temp 98.1°F | Resp 18

## 2014-12-04 DIAGNOSIS — E559 Vitamin D deficiency, unspecified: Secondary | ICD-10-CM

## 2014-12-04 DIAGNOSIS — D509 Iron deficiency anemia, unspecified: Secondary | ICD-10-CM

## 2014-12-04 DIAGNOSIS — D510 Vitamin B12 deficiency anemia due to intrinsic factor deficiency: Secondary | ICD-10-CM

## 2014-12-04 MED ORDER — ERGOCALCIFEROL 1.25 MG (50000 UT) PO CAPS: 50000.0000 [IU] | ORAL_CAPSULE | ORAL | Status: DC

## 2014-12-04 MED ORDER — SODIUM CHLORIDE 0.9 % IV SOLN
510.0000 mg | Freq: Once | INTRAVENOUS | Status: AC
  Administered 2014-12-04: 510 mg via INTRAVENOUS
  Filled 2014-12-04: qty 17

## 2014-12-04 MED ORDER — SODIUM CHLORIDE 0.9 % IV SOLN
Freq: Once | INTRAVENOUS | Status: AC
Start: 2014-12-04 — End: 2014-12-04
  Administered 2014-12-04: 13:00:00 via INTRAVENOUS

## 2014-12-04 NOTE — Patient Instructions (Signed)

## 2015-02-15 ENCOUNTER — Telehealth: Payer: Self-pay | Admitting: Hematology & Oncology

## 2015-02-15 ENCOUNTER — Other Ambulatory Visit: Payer: 59

## 2015-02-15 ENCOUNTER — Ambulatory Visit: Payer: 59 | Admitting: Hematology & Oncology

## 2015-02-15 NOTE — Telephone Encounter (Signed)
Pt called to reschedule 8/5, due to moving mom into the home

## 2015-03-06 ENCOUNTER — Other Ambulatory Visit: Payer: Self-pay | Admitting: Family

## 2015-03-08 ENCOUNTER — Encounter: Payer: Self-pay | Admitting: Hematology & Oncology

## 2015-03-08 ENCOUNTER — Other Ambulatory Visit (HOSPITAL_BASED_OUTPATIENT_CLINIC_OR_DEPARTMENT_OTHER): Payer: Commercial Managed Care - HMO

## 2015-03-08 ENCOUNTER — Ambulatory Visit (HOSPITAL_BASED_OUTPATIENT_CLINIC_OR_DEPARTMENT_OTHER): Payer: Commercial Managed Care - HMO | Admitting: Hematology & Oncology

## 2015-03-08 VITALS — BP 119/64 | HR 87 | Temp 98.0°F | Resp 18 | Ht 62.0 in | Wt 106.0 lb

## 2015-03-08 DIAGNOSIS — D509 Iron deficiency anemia, unspecified: Secondary | ICD-10-CM | POA: Diagnosis not present

## 2015-03-08 DIAGNOSIS — K909 Intestinal malabsorption, unspecified: Secondary | ICD-10-CM

## 2015-03-08 DIAGNOSIS — D510 Vitamin B12 deficiency anemia due to intrinsic factor deficiency: Secondary | ICD-10-CM

## 2015-03-08 DIAGNOSIS — K5090 Crohn's disease, unspecified, without complications: Secondary | ICD-10-CM

## 2015-03-08 DIAGNOSIS — K50113 Crohn's disease of large intestine with fistula: Secondary | ICD-10-CM

## 2015-03-08 LAB — CBC WITH DIFFERENTIAL (CANCER CENTER ONLY)
BASO#: 0 10*3/uL (ref 0.0–0.2)
BASO%: 0.2 % (ref 0.0–2.0)
EOS%: 1.3 % (ref 0.0–7.0)
Eosinophils Absolute: 0.1 10*3/uL (ref 0.0–0.5)
HEMATOCRIT: 36.6 % (ref 34.8–46.6)
HEMOGLOBIN: 12 g/dL (ref 11.6–15.9)
LYMPH#: 1.7 10*3/uL (ref 0.9–3.3)
LYMPH%: 18.2 % (ref 14.0–48.0)
MCH: 31.3 pg (ref 26.0–34.0)
MCHC: 32.8 g/dL (ref 32.0–36.0)
MCV: 96 fL (ref 81–101)
MONO#: 0.5 10*3/uL (ref 0.1–0.9)
MONO%: 5.5 % (ref 0.0–13.0)
NEUT%: 74.8 % (ref 39.6–80.0)
NEUTROS ABS: 7.1 10*3/uL — AB (ref 1.5–6.5)
PLATELETS: 269 10*3/uL (ref 145–400)
RBC: 3.83 10*6/uL (ref 3.70–5.32)
RDW: 13.3 % (ref 11.1–15.7)
WBC: 9.5 10*3/uL (ref 3.9–10.0)

## 2015-03-08 NOTE — Progress Notes (Signed)
Hematology and Oncology Follow Up Visit  Denise Lawson [id number] 03/27/1956 59 y.o. 03/08/2015   Principle Diagnosis:   Recurrent iron deficiency anemia  Pernicious anemia  Crohn's disease-exacerbation  Current Therapy:    IV iron as indicated  Vitamin B12 1 mg IM every month     Interim History:  Ms.  Chrismer is back for followup. She is very busy right now. Her mother is with hospice and living with her. She has aortic stenosis. She has other health issues.  Ms. Gloriajean Dell has not had any exacerbations of her Crohn's disease.  She got iron back in May.  She does vitamin B-12 at home.  She's had no bleeding.  She's had no problems with bleeding. There's been no leg swelling. She's had some nausea but no vomiting.  There's not been any rashes. She's had no mouth sores.   Medications:  Current outpatient prescriptions:  .  cyanocobalamin (,VITAMIN B-12,) 1000 MCG/ML injection, Inject 1 mL (1,000 mcg total) into the muscle once a week. Every Friday (For 3 Months), Disp: 1 mL, Rfl: 18 .  dimenhyDRINATE (DRAMAMINE) 50 MG tablet, Take 50 mg by mouth as needed. , Disp: , Rfl:  .  ergocalciferol (VITAMIN D2) 50000 UNITS capsule, Take 1 capsule (50,000 Units total) by mouth once a week., Disp: 4 capsule, Rfl: 12 .  ibuprofen (ADVIL,MOTRIN) 200 MG tablet, Take 400 mg by mouth every 6 (six) hours as needed for pain. , Disp: , Rfl:  .  nitrofurantoin (MACRODANTIN) 50 MG capsule, Take 50 mg by mouth at bedtime., Disp: , Rfl:  .  NON FORMULARY, Take by mouth as needed. OTC for diarrhea, Disp: , Rfl:  .  NON FORMULARY, Take by mouth every morning. EHT  Herbal supplement, Disp: , Rfl:  .  oxyCODONE-acetaminophen (PERCOCET/ROXICET) 5-325 MG per tablet, Take by mouth as needed for severe pain. PT NOT SURE OF DOSE, Disp: , Rfl:  .  Promethazine HCl (PHENERGAN PO), Take 25 mg by mouth as needed. Dose ?, Disp: , Rfl:   Allergies:  Allergies  Allergen Reactions  . Darvon  [Propoxyphene Hcl]     Hives  . Iohexol      Desc: VOMITING-10 YRS AGO @ [facility].     Past Medical History, Surgical history, Social history, and Family History were reviewed and updated.  Review of Systems: As above  Physical Exam:  height is 5' 2"  (1.575 m) and weight is 106 lb (48.081 kg). Her oral temperature is 98 F (36.7 C). Her blood pressure is 119/64 and her pulse is 87. Her respiration is 18.   Thin white female. Her head and exam shows no ocular or oral lesion. She is no palpable cervical or supraclavicular lymph nodes. Lungs are clear. Cardiac exam regular rate and rhythm with no murmurs rubs or bruits. Abdomen is soft. Some tenderness to palpation. She has active bowel sounds. She is no distention. She has no palpable liver or spleen. Extremities shows no clubbing cyanosis or edema. Skin exam no rashes, ecchymoses or petechia. Neurological exam is non-focal.  Lab Results  Component Value Date   WBC 9.5 03/08/2015   HGB 12.0 03/08/2015   HCT 36.6 03/08/2015   MCV 96 03/08/2015   PLT 269 03/08/2015     Chemistry      Component Value Date/Time   NA 139 11/16/2014 1031   K 3.8 11/16/2014 1031   CL 106 11/16/2014 1031   CO2 22 11/16/2014 1031   BUN 13 11/16/2014  1031   CREATININE 0.66 11/16/2014 1031      Component Value Date/Time   CALCIUM 9.2 11/16/2014 1031   ALKPHOS 82 11/16/2014 1031   AST 12 11/16/2014 1031   ALT 12 11/16/2014 1031   BILITOT 0.3 11/16/2014 1031         Impression and Plan: Ms. Rita is 59 year old white female. She has Crohn's disease. She is under some stress. Her mom has a terminal condition with the aortic stenosis. However, she was seems to be getting some help from her family.  We will see what the iron level shows. I would think that her iron should be okay. She just got iron back in May.  I will plan to see her back in another 3-4 months.  Volanda Napoleon, MD 8/5/20162:52 PM

## 2015-03-09 LAB — COMPREHENSIVE METABOLIC PANEL
ALT: 13 U/L (ref 6–29)
AST: 14 U/L (ref 10–35)
Albumin: 3.5 g/dL — ABNORMAL LOW (ref 3.6–5.1)
Alkaline Phosphatase: 82 U/L (ref 33–130)
BUN: 17 mg/dL (ref 7–25)
CALCIUM: 9.5 mg/dL (ref 8.6–10.4)
CHLORIDE: 103 mmol/L (ref 98–110)
CO2: 24 mmol/L (ref 20–31)
Creatinine, Ser: 0.73 mg/dL (ref 0.50–1.05)
GLUCOSE: 90 mg/dL (ref 65–99)
POTASSIUM: 3.9 mmol/L (ref 3.5–5.3)
Sodium: 139 mmol/L (ref 135–146)
Total Bilirubin: 0.3 mg/dL (ref 0.2–1.2)
Total Protein: 6.5 g/dL (ref 6.1–8.1)

## 2015-03-09 LAB — VITAMIN B12: Vitamin B-12: 317 pg/mL (ref 211–911)

## 2015-03-09 LAB — VITAMIN D 25 HYDROXY (VIT D DEFICIENCY, FRACTURES): Vit D, 25-Hydroxy: 19 ng/mL — ABNORMAL LOW (ref 30–100)

## 2015-03-11 ENCOUNTER — Other Ambulatory Visit: Payer: Self-pay | Admitting: *Deleted

## 2015-03-11 DIAGNOSIS — D509 Iron deficiency anemia, unspecified: Secondary | ICD-10-CM

## 2015-03-11 LAB — IRON AND TIBC CHCC
%SAT: 16 % — ABNORMAL LOW (ref 21–57)
Iron: 44 ug/dL (ref 41–142)
TIBC: 274 ug/dL (ref 236–444)
UIBC: 230 ug/dL (ref 120–384)

## 2015-03-11 LAB — FERRITIN CHCC: Ferritin: 1071 ng/ml — ABNORMAL HIGH (ref 9–269)

## 2015-03-11 MED ORDER — CYANOCOBALAMIN 1000 MCG/ML IJ SOLN: 1000.0000 ug | INTRAMUSCULAR | Status: DC

## 2015-03-15 ENCOUNTER — Telehealth: Payer: Self-pay | Admitting: *Deleted

## 2015-03-15 DIAGNOSIS — E559 Vitamin D deficiency, unspecified: Secondary | ICD-10-CM

## 2015-03-15 DIAGNOSIS — D509 Iron deficiency anemia, unspecified: Secondary | ICD-10-CM

## 2015-03-15 MED ORDER — ERGOCALCIFEROL 1.25 MG (50000 UT) PO CAPS: 50000.0000 [IU] | ORAL_CAPSULE | ORAL | Status: DC

## 2015-03-15 NOTE — Telephone Encounter (Signed)
Per Emeline Gins NP, patient is to start taking vitamin D, 50,000 units twice weekly. Patient understand and requesting new prescription. Will send in.

## 2015-03-18 ENCOUNTER — Other Ambulatory Visit: Payer: Self-pay

## 2015-03-18 DIAGNOSIS — D509 Iron deficiency anemia, unspecified: Secondary | ICD-10-CM

## 2015-03-18 MED ORDER — CYANOCOBALAMIN 1000 MCG/ML IJ SOLN: 1000.0000 ug | INTRAMUSCULAR | Status: DC

## 2015-07-12 ENCOUNTER — Ambulatory Visit: Payer: Commercial Managed Care - HMO | Admitting: Hematology & Oncology

## 2015-07-12 ENCOUNTER — Other Ambulatory Visit: Payer: Commercial Managed Care - HMO

## 2015-07-19 ENCOUNTER — Ambulatory Visit: Payer: Commercial Managed Care - HMO | Admitting: Family

## 2015-07-19 ENCOUNTER — Other Ambulatory Visit: Payer: Commercial Managed Care - HMO

## 2015-11-27 ENCOUNTER — Ambulatory Visit (HOSPITAL_BASED_OUTPATIENT_CLINIC_OR_DEPARTMENT_OTHER): Payer: Commercial Managed Care - HMO | Admitting: Family

## 2015-11-27 ENCOUNTER — Encounter: Payer: Self-pay | Admitting: Family

## 2015-11-27 ENCOUNTER — Other Ambulatory Visit (HOSPITAL_BASED_OUTPATIENT_CLINIC_OR_DEPARTMENT_OTHER): Payer: Commercial Managed Care - HMO

## 2015-11-27 VITALS — BP 116/45 | HR 57 | Temp 97.7°F | Resp 16 | Ht 62.0 in | Wt 104.0 lb

## 2015-11-27 DIAGNOSIS — D500 Iron deficiency anemia secondary to blood loss (chronic): Secondary | ICD-10-CM

## 2015-11-27 DIAGNOSIS — D509 Iron deficiency anemia, unspecified: Secondary | ICD-10-CM

## 2015-11-27 DIAGNOSIS — D649 Anemia, unspecified: Secondary | ICD-10-CM | POA: Diagnosis not present

## 2015-11-27 DIAGNOSIS — K5090 Crohn's disease, unspecified, without complications: Secondary | ICD-10-CM

## 2015-11-27 DIAGNOSIS — K909 Intestinal malabsorption, unspecified: Secondary | ICD-10-CM

## 2015-11-27 DIAGNOSIS — D510 Vitamin B12 deficiency anemia due to intrinsic factor deficiency: Secondary | ICD-10-CM | POA: Diagnosis not present

## 2015-11-27 LAB — CBC WITH DIFFERENTIAL (CANCER CENTER ONLY)
BASO#: 0 10*3/uL (ref 0.0–0.2)
BASO%: 0.3 % (ref 0.0–2.0)
EOS%: 4.1 % (ref 0.0–7.0)
Eosinophils Absolute: 0.4 10*3/uL (ref 0.0–0.5)
HEMATOCRIT: 38.7 % (ref 34.8–46.6)
HEMOGLOBIN: 12.6 g/dL (ref 11.6–15.9)
LYMPH#: 2.3 10*3/uL (ref 0.9–3.3)
LYMPH%: 25.2 % (ref 14.0–48.0)
MCH: 31 pg (ref 26.0–34.0)
MCHC: 32.6 g/dL (ref 32.0–36.0)
MCV: 95 fL (ref 81–101)
MONO#: 0.5 10*3/uL (ref 0.1–0.9)
MONO%: 5.5 % (ref 0.0–13.0)
NEUT%: 64.9 % (ref 39.6–80.0)
NEUTROS ABS: 5.9 10*3/uL (ref 1.5–6.5)
Platelets: 287 10*3/uL (ref 145–400)
RBC: 4.06 10*6/uL (ref 3.70–5.32)
RDW: 13.9 % (ref 11.1–15.7)
WBC: 9.1 10*3/uL (ref 3.9–10.0)

## 2015-11-27 LAB — FERRITIN: Ferritin: 802 ng/ml — ABNORMAL HIGH (ref 9–269)

## 2015-11-27 LAB — IRON AND TIBC
%SAT: 25 % (ref 21–57)
Iron: 69 ug/dL (ref 41–142)
TIBC: 275 ug/dL (ref 236–444)
UIBC: 206 ug/dL (ref 120–384)

## 2015-11-27 NOTE — Progress Notes (Signed)
Hematology and Oncology Follow Up Visit  Denise Lawson [id number] 1955/08/19 60 y.o. 11/27/2015   Principle Diagnosis:   Recurrent iron deficiency anemia  Pernicious anemia  Crohn's disease - exacerbation  Current Therapy:    IV iron as indicated - last infusion May 2016  Vitamin B12 1 mg IM every month    Interim History:  Denise Lawson is here today for a follow-up. She has had a hard time with her Crohn's disease and frequent flares. So far she has been able to manage this at home and has not required hospitalization. She is followed closely by GI and states that she can no longer have colonoscopies due to scarring and blockages/perforation in the past.  She states that she has diarrhea 20 or more times a day but can not take imodium or lomotil due to fear of causing another blockage. She has noticed bright red blood in her stools and attributes this to her flare and frequency. Her Hgb is 12.6 at this time with an MCV of 95.  She c/o fatigue and dizziness at times. No falls or syncopal episodes.  No fever, chills, n/v, headaches, dizziness, blurred vision, rash, cough, SOB, chest pain, palpitations, abdominal pain or changes in bladder habits.  She gives herself vit B12 injections monthly at home.  Her appetite is ok and she is staying hydrated. She plans to follow up with a nutritionist and better modify her diet. Her weight is stable.   Medications:    Medication List       This list is accurate as of: 11/27/15 11:05 AM.  Always use your most recent med list.               cyanocobalamin 1000 MCG/ML injection  Commonly known as:  (VITAMIN B-12)  Inject 1 mL (1,000 mcg total) into the muscle every 30 (thirty) days. Every Friday (For 3 Months)     dimenhyDRINATE 50 MG tablet  Commonly known as:  DRAMAMINE  Take 50 mg by mouth as needed.     ergocalciferol 50000 units capsule  Commonly known as:  VITAMIN D2  Take 1 capsule (50,000 Units total) by mouth 2 (two) times a  week.     ibuprofen 200 MG tablet  Commonly known as:  ADVIL,MOTRIN  Take 400 mg by mouth every 6 (six) hours as needed for pain.     nitrofurantoin 50 MG capsule  Commonly known as:  MACRODANTIN  Take 50 mg by mouth at bedtime.     NON FORMULARY  Take by mouth as needed. OTC for diarrhea     NON FORMULARY  Take by mouth every morning. EHT  Herbal supplement     oxyCODONE-acetaminophen 5-325 MG tablet  Commonly known as:  PERCOCET/ROXICET  Take by mouth as needed for severe pain. Reported on 11/27/2015     PHENERGAN PO  Take 25 mg by mouth as needed. Reported on 11/27/2015     Vitamin D3 50000 units Caps  Take by mouth.        Allergies:  Allergies  Allergen Reactions  . Darvon [Propoxyphene Hcl]     Hives  . Tamiflu  [Oseltamivir] Rash  . Iohexol      Desc: VOMITING-10 YRS AGO @ [facility].   Marland Kitchen Morphine And Related Other (See Comments)    Past Medical History, Surgical history, Social history, and Family History were reviewed and updated.  Review of Systems: All other 10 point review of systems is negative.   Physical  Exam:  height is 5\' 2"  (1.575 m) and weight is 104 lb (47.174 kg). Her temperature is 97.7 F (36.5 C). Her blood pressure is 116/45 and her pulse is 57. Her respiration is 16.   Wt Readings from Last 3 Encounters:  11/27/15 104 lb (47.174 kg)  03/08/15 106 lb (48.081 kg)  11/16/14 110 lb (49.896 kg)    Ocular: Sclerae unicteric, pupils equal, round and reactive to light Ear-nose-throat: Oropharynx clear, dentition fair Lymphatic: No cervical supraclavicular or axillary adenopathy Lungs no rales or rhonchi, good excursion bilaterally Heart regular rate and rhythm, no murmur appreciated Abd soft, nontender, positive bowel sounds, no liver or spleen tip palpated on exam, no fluid wave MSK no focal spinal tenderness, no joint edema Neuro: non-focal, well-oriented, appropriate affect Breasts: Deferred  Lab Results  Component Value Date    WBC 9.1 11/27/2015   HGB 12.6 11/27/2015   HCT 38.7 11/27/2015   MCV 95 11/27/2015   PLT 287 11/27/2015   Lab Results  Component Value Date   FERRITIN 1,071* 03/08/2015   IRON 44 03/08/2015   TIBC 274 03/08/2015   UIBC 230 03/08/2015   IRONPCTSAT 16* 03/08/2015   Lab Results  Component Value Date   RETICCTPCT 1.2 12/25/2011   RBC 4.06 11/27/2015   RETICCTABS 49.8 12/25/2011   No results found for: KPAFRELGTCHN, LAMBDASER, KAPLAMBRATIO No results found for: IGGSERUM, IGA, IGMSERUM No results found for: Marda Stalker, SPEI   Chemistry      Component Value Date/Time   NA 139 03/08/2015 1342   K 3.9 03/08/2015 1342   CL 103 03/08/2015 1342   CO2 24 03/08/2015 1342   BUN 17 03/08/2015 1342   CREATININE 0.73 03/08/2015 1342      Component Value Date/Time   CALCIUM 9.5 03/08/2015 1342   ALKPHOS 82 03/08/2015 1342   AST 14 03/08/2015 1342   ALT 13 03/08/2015 1342   BILITOT 0.3 03/08/2015 1342     Impression and Plan: Denise Lawson is a very pleasant 60 yo white female with multifactorial anemia that includes iron deficiency anemia secondary to chronic blood loss due to Crohn's disease.  She is currently having a flare and has had diarrhea with bright red blood in her stool at times.  She states that Gi will no longer do colonoscopies on her due to scarring blockages/perforation. She also states that she does not currently qualify for any type of treatment for the same reasons.  Her Hgb is stable at 12.6 with an MCV of 95. We will see what her iron studies show and bring her in later this week for Feraheme if needed.  We will go ahead and pan to see her back in 3 months for lab work and follow-up.  She will contact us with any questions or concerns. We can certainly see her sooner if need be.   Verdie Mosher, NP 4/26/201711:05 AM

## 2015-11-28 LAB — RETICULOCYTES: Reticulocyte Count: 0.8 % (ref 0.6–2.6)

## 2015-11-28 LAB — VITAMIN B12: Vitamin B12: 258 pg/mL (ref 211–946)

## 2016-02-24 ENCOUNTER — Ambulatory Visit: Payer: Commercial Managed Care - HMO | Admitting: Family

## 2016-02-24 ENCOUNTER — Other Ambulatory Visit: Payer: Commercial Managed Care - HMO

## 2016-03-04 ENCOUNTER — Ambulatory Visit (HOSPITAL_BASED_OUTPATIENT_CLINIC_OR_DEPARTMENT_OTHER): Payer: Commercial Managed Care - HMO | Admitting: Hematology & Oncology

## 2016-03-04 ENCOUNTER — Encounter: Payer: Self-pay | Admitting: Hematology & Oncology

## 2016-03-04 ENCOUNTER — Other Ambulatory Visit (HOSPITAL_BASED_OUTPATIENT_CLINIC_OR_DEPARTMENT_OTHER): Payer: Commercial Managed Care - HMO

## 2016-03-04 VITALS — BP 112/54 | HR 68 | Temp 97.8°F | Resp 18 | Ht 62.0 in | Wt 102.0 lb

## 2016-03-04 DIAGNOSIS — D500 Iron deficiency anemia secondary to blood loss (chronic): Secondary | ICD-10-CM

## 2016-03-04 DIAGNOSIS — K50913 Crohn's disease, unspecified, with fistula: Secondary | ICD-10-CM

## 2016-03-04 DIAGNOSIS — E559 Vitamin D deficiency, unspecified: Secondary | ICD-10-CM

## 2016-03-04 DIAGNOSIS — D509 Iron deficiency anemia, unspecified: Secondary | ICD-10-CM

## 2016-03-04 DIAGNOSIS — D510 Vitamin B12 deficiency anemia due to intrinsic factor deficiency: Secondary | ICD-10-CM

## 2016-03-04 DIAGNOSIS — K909 Intestinal malabsorption, unspecified: Secondary | ICD-10-CM

## 2016-03-04 DIAGNOSIS — K9089 Other intestinal malabsorption: Secondary | ICD-10-CM | POA: Diagnosis not present

## 2016-03-04 DIAGNOSIS — K50113 Crohn's disease of large intestine with fistula: Secondary | ICD-10-CM

## 2016-03-04 LAB — FERRITIN: Ferritin: 681 ng/ml — ABNORMAL HIGH (ref 9–269)

## 2016-03-04 LAB — CBC WITH DIFFERENTIAL (CANCER CENTER ONLY)
BASO#: 0 10*3/uL (ref 0.0–0.2)
BASO%: 0.2 % (ref 0.0–2.0)
EOS%: 2.9 % (ref 0.0–7.0)
Eosinophils Absolute: 0.3 10*3/uL (ref 0.0–0.5)
HEMATOCRIT: 41 % (ref 34.8–46.6)
HGB: 13.1 g/dL (ref 11.6–15.9)
LYMPH#: 1.8 10*3/uL (ref 0.9–3.3)
LYMPH%: 19.5 % (ref 14.0–48.0)
MCH: 31.3 pg (ref 26.0–34.0)
MCHC: 32 g/dL (ref 32.0–36.0)
MCV: 98 fL (ref 81–101)
MONO#: 0.6 10*3/uL (ref 0.1–0.9)
MONO%: 7 % (ref 0.0–13.0)
NEUT#: 6.3 10*3/uL (ref 1.5–6.5)
NEUT%: 70.4 % (ref 39.6–80.0)
Platelets: 275 10*3/uL (ref 145–400)
RBC: 4.19 10*6/uL (ref 3.70–5.32)
RDW: 13.6 % (ref 11.1–15.7)
WBC: 9 10*3/uL (ref 3.9–10.0)

## 2016-03-04 LAB — IRON AND TIBC
%SAT: 18 % — ABNORMAL LOW (ref 21–57)
Iron: 50 ug/dL (ref 41–142)
TIBC: 274 ug/dL (ref 236–444)
UIBC: 223 ug/dL (ref 120–384)

## 2016-03-04 MED ORDER — CYANOCOBALAMIN 1000 MCG/ML IJ SOLN: 1000.0000 ug | INTRAMUSCULAR | 5 refills | Status: DC

## 2016-03-04 NOTE — Progress Notes (Signed)
Hematology and Oncology Follow Up Visit  Denise Lawson [id number] 1956-06-15 60 y.o. 03/04/2016   Principle Diagnosis:   Recurrent iron deficiency anemia  Pernicious anemia  Crohn's disease-exacerbation  Current Therapy:    IV iron as indicated  Vitamin B12 1 mg IM every month     Interim History:  Denise Lawson is back for followup. She is looking quite good. She is nice and 11. She went to the beach for a week with her family. She actually lives at the [place] with her husband. Pap her mom passed away peacefully in 05/03/2023. This was a huge relief to her. Her mom did not suffer long.  Her daughter is looking for a job as a Building control surveyor. Hopefully she will get this.  She's had no flareups of the Crohn's disease. 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  We saw her in April, her ferritin was 800 with iron saturation of 25%. A lot of the ferritin elevation is inflammatory.  Her vitamin B-12 level was 258.  There's not been any rashes. She's had no mouth sores.  Overall, her performance status is ECOG 1.   Medications:  Current Outpatient Prescriptions:  .  Cholecalciferol (VITAMIN D3) 50000 units CAPS, Take by mouth., Disp: , Rfl:  .  cyanocobalamin (,VITAMIN B-12,) 1000 MCG/ML injection, Inject 1 mL (1,000 mcg total) into the muscle every 30 (thirty) days. Every Friday (For 3 Months), Disp: 1 mL, Rfl: 5 .  dimenhyDRINATE (DRAMAMINE) 50 MG tablet, Take 50 mg by mouth as needed. , Disp: , Rfl:  .  ibuprofen (ADVIL,MOTRIN) 200 MG tablet, Take 400 mg by mouth every 6 (six) hours as needed for pain. , Disp: , Rfl:  .  Promethazine HCl (PHENERGAN PO), Take 25 mg by mouth as needed. Reported on 11/27/2015, Disp: , Rfl:   Allergies:  Allergies  Allergen Reactions  . Darvon [Propoxyphene Hcl]     Hives  . Tamiflu  [Oseltamivir] Rash  . Iohexol      Desc: VOMITING-10 YRS AGO @ [facility].   Marland Kitchen Morphine And Related Other (See Comments)    Past Medical  History, Surgical history, Social history, and Family History were reviewed and updated.  Review of Systems: As above  Physical Exam:  height is 5' 2"  (1.575 m) and weight is 102 lb (46.3 kg). Her oral temperature is 97.8 F (36.6 C). Her blood pressure is 112/54 (abnormal) and her pulse is 68. Her respiration is 18.   Thin white female. Her head and exam shows no ocular or oral lesion. She is no palpable cervical or supraclavicular lymph nodes. Lungs are clear. Cardiac exam regular rate and rhythm with no murmurs rubs or bruits. Abdomen is soft. Some tenderness to palpation. She has active bowel sounds. She is no distention. She has no palpable liver or spleen. Extremities shows no clubbing cyanosis or edema. Skin exam no rashes, ecchymoses or petechia. Neurological exam is non-focal.  Lab Results  Component Value Date   WBC 9.0 03/04/2016   HGB 13.1 03/04/2016   HCT 41.0 03/04/2016   MCV 98 03/04/2016   PLT 275 03/04/2016     Chemistry      Component Value Date/Time   NA 139 03/08/2015 1342   K 3.9 03/08/2015 1342   CL 103 03/08/2015 1342   CO2 24 03/08/2015 1342   BUN 17 03/08/2015 1342   CREATININE 0.73 03/08/2015 1342      Component Value Date/Time   CALCIUM  9.5 03/08/2015 1342   ALKPHOS 82 03/08/2015 1342   AST 14 03/08/2015 1342   ALT 13 03/08/2015 1342   BILITOT 0.3 03/08/2015 1342         Impression and Plan: Denise Lawson is 60 year old white female. She has Crohn's disease.She is doing pretty well. Her mom passed away in May 11, 2023 I think to a load of stress off of her.  For now, I think we'll probably get her back in 4 months. We'll see the iron studies show. I had completely iron is going be okay given her MCV being a little bit higher.  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 We did watch out with her vitamin D and vitamin B-12 levels also.   Volanda Napoleon, MD 8/2/201710:09 AM

## 2016-03-05 LAB — RETICULOCYTES: RETICULOCYTE COUNT: 1.1 % (ref 0.6–2.6)

## 2016-03-05 LAB — VITAMIN D 25 HYDROXY (VIT D DEFICIENCY, FRACTURES): Vitamin D, 25-Hydroxy: 21.3 ng/mL — ABNORMAL LOW (ref 30.0–100.0)

## 2016-03-05 LAB — VITAMIN B12: Vitamin B12: 238 pg/mL (ref 211–946)

## 2016-03-09 ENCOUNTER — Other Ambulatory Visit: Payer: Self-pay | Admitting: Nurse Practitioner

## 2016-03-09 ENCOUNTER — Telehealth: Payer: Self-pay | Admitting: Nurse Practitioner

## 2016-03-09 DIAGNOSIS — D510 Vitamin B12 deficiency anemia due to intrinsic factor deficiency: Secondary | ICD-10-CM

## 2016-03-09 DIAGNOSIS — K909 Intestinal malabsorption, unspecified: Secondary | ICD-10-CM

## 2016-03-09 MED ORDER — ERGOCALCIFEROL 1.25 MG (50000 UT) PO CAPS: 50000.0000 [IU] | ORAL_CAPSULE | ORAL | 3 refills | Status: DC

## 2016-03-09 MED ORDER — VITAMIN D3 1.25 MG (50000 UT) PO CAPS: 50000.0000 [IU] | ORAL_CAPSULE | ORAL | 3 refills | Status: DC

## 2016-03-09 NOTE — Telephone Encounter (Addendum)
Pt verbalized understanding. Per Dr. Myna Hidalgo she was instructed to take Vit D weekly vs monthly. A new script has been sent to her pharmacy. ----- Message from Josph Macho, MD sent at 03/05/2016  5:39 PM EDT ----- Call - vit D level is still quite low.  It is only 21.  Denise Lawson

## 2016-06-05 ENCOUNTER — Other Ambulatory Visit (HOSPITAL_BASED_OUTPATIENT_CLINIC_OR_DEPARTMENT_OTHER): Payer: Commercial Managed Care - HMO

## 2016-06-05 ENCOUNTER — Ambulatory Visit (HOSPITAL_BASED_OUTPATIENT_CLINIC_OR_DEPARTMENT_OTHER): Payer: Commercial Managed Care - HMO | Admitting: Hematology & Oncology

## 2016-06-05 VITALS — BP 102/55 | HR 92 | Temp 98.3°F | Resp 20 | Wt 102.0 lb

## 2016-06-05 DIAGNOSIS — K5090 Crohn's disease, unspecified, without complications: Secondary | ICD-10-CM | POA: Diagnosis not present

## 2016-06-05 DIAGNOSIS — D510 Vitamin B12 deficiency anemia due to intrinsic factor deficiency: Secondary | ICD-10-CM | POA: Diagnosis not present

## 2016-06-05 DIAGNOSIS — D509 Iron deficiency anemia, unspecified: Secondary | ICD-10-CM | POA: Diagnosis not present

## 2016-06-05 DIAGNOSIS — K909 Intestinal malabsorption, unspecified: Secondary | ICD-10-CM

## 2016-06-05 DIAGNOSIS — K50113 Crohn's disease of large intestine with fistula: Secondary | ICD-10-CM

## 2016-06-05 DIAGNOSIS — E559 Vitamin D deficiency, unspecified: Secondary | ICD-10-CM

## 2016-06-05 LAB — COMPREHENSIVE METABOLIC PANEL
ALT: 12 U/L (ref 0–55)
AST: 13 U/L (ref 5–34)
Albumin: 3.3 g/dL — ABNORMAL LOW (ref 3.5–5.0)
Alkaline Phosphatase: 107 U/L (ref 40–150)
Anion Gap: 10 mEq/L (ref 3–11)
BUN: 10.5 mg/dL (ref 7.0–26.0)
CO2: 25 meq/L (ref 22–29)
Calcium: 9.5 mg/dL (ref 8.4–10.4)
Chloride: 108 mEq/L (ref 98–109)
Creatinine: 0.8 mg/dL (ref 0.6–1.1)
EGFR: 82 mL/min/{1.73_m2} — AB (ref >90)
GLUCOSE: 94 mg/dL (ref 70–140)
POTASSIUM: 4 meq/L (ref 3.5–5.1)
SODIUM: 143 meq/L (ref 136–145)
TOTAL PROTEIN: 7.5 g/dL (ref 6.4–8.3)
Total Bilirubin: 0.24 mg/dL (ref 0.20–1.20)

## 2016-06-05 LAB — CBC WITH DIFFERENTIAL (CANCER CENTER ONLY)
BASO#: 0 10*3/uL (ref 0.0–0.2)
BASO%: 0.3 % (ref 0.0–2.0)
EOS ABS: 0.3 10*3/uL (ref 0.0–0.5)
EOS%: 2.4 % (ref 0.0–7.0)
HCT: 38.9 % (ref 34.8–46.6)
HGB: 12.4 g/dL (ref 11.6–15.9)
LYMPH#: 1.8 10*3/uL (ref 0.9–3.3)
LYMPH%: 17.3 % (ref 14.0–48.0)
MCH: 30.5 pg (ref 26.0–34.0)
MCHC: 31.9 g/dL — AB (ref 32.0–36.0)
MCV: 96 fL (ref 81–101)
MONO#: 0.6 10*3/uL (ref 0.1–0.9)
MONO%: 5.7 % (ref 0.0–13.0)
NEUT%: 74.3 % (ref 39.6–80.0)
NEUTROS ABS: 7.7 10*3/uL — AB (ref 1.5–6.5)
PLATELETS: 240 10*3/uL (ref 145–400)
RBC: 4.06 10*6/uL (ref 3.70–5.32)
RDW: 13.6 % (ref 11.1–15.7)
WBC: 10.3 10*3/uL — AB (ref 3.9–10.0)

## 2016-06-05 NOTE — Progress Notes (Signed)
Hematology and Oncology Follow Up Visit  Denise Lawson [id number] [birth year] 60 y.o. 06/05/2016   Principle Diagnosis:   Recurrent iron deficiency anemia  Pernicious anemia  Crohn's disease-exacerbation  Current Therapy:    IV iron as indicated  Vitamin B12 1 mg IM every month     Interim History:  Ms.  Lawson is back for followup. She is looking quite good. She has an issue with her right lower leg. Looks like she had has some kind of insect bite. There is a area of erythema and some firmness. She is a central eschar. There is no exudate. She said that a week or so ago there was a little bit of a purulent discharge. She saw her family doctor. She went ahead and put her on Bactrim and Bactroban ointment.  She's had no fever.  Her Crohn's disease is not flared up.  Her last iron studies that we did back in August showed a ferritin of 681 with iron saturation of 18%. The last him that what she got iron was probably back in 2015-01-24.   Her mom passed away about a year so ago. She is dealing with this very nicely. It has been tough on her. Her mom left the dog that Denise Lawson is now taking care of.  She has had no rashes. She's had no headache. She had one headache after she discovered this insect bite on her right leg.  Overall, her performance status is ECOG 1.   Medications:  Current Outpatient Prescriptions:  .  cyanocobalamin (,VITAMIN B-12,) 1000 MCG/ML injection, Inject 1 mL (1,000 mcg total) into the muscle every 30 (thirty) days. Every Friday (For 3 Months), Disp: 1 mL, Rfl: 5 .  dimenhyDRINATE (DRAMAMINE) 50 MG tablet, Take 50 mg by mouth as needed. , Disp: , Rfl:  .  ergocalciferol (VITAMIN D2) 50000 units capsule, Take 1 capsule (50,000 Units total) by mouth once a week. (Patient taking differently: Take 50,000 Units by mouth 2 (two) times a week. ), Disp: 12 capsule, Rfl: 3 .  ibuprofen (ADVIL,MOTRIN) 200 MG tablet, Take 400 mg by mouth every 6 (six) hours as needed  for pain. , Disp: , Rfl:  .  mupirocin ointment (BACTROBAN) 2 %, 2 (two) times daily., Disp: , Rfl:  .  nitrofurantoin (MACRODANTIN) 50 MG capsule, Take 50 mg by mouth daily., Disp: , Rfl:  .  Promethazine HCl (PHENERGAN PO), Take 25 mg by mouth as needed. Reported on 11/27/2015, Disp: , Rfl:  .  sulfamethoxazole-trimethoprim (BACTRIM DS,SEPTRA DS) 800-160 MG tablet, Take by mouth 2 (two) times daily., Disp: , Rfl:   Allergies:  Allergies  Allergen Reactions  . Darvon [Propoxyphene Hcl]     Hives  . Tamiflu  [Oseltamivir] Rash  . Iohexol      Desc: VOMITING-10 YRS AGO @ [facility].   Marland Kitchen Morphine And Related Other (See Comments)    Past Medical History, Surgical history, Social history, and Family History were reviewed and updated.  Review of Systems: As above  Physical Exam:  weight is 102 lb (46.3 kg). Her oral temperature is 98.3 F (36.8 C). Her blood pressure is 102/55 (abnormal) and her pulse is 92. Her respiration is 20.   Thin white female. Her head and exam shows no ocular or oral lesion. She is no palpable cervical or supraclavicular lymph nodes. Lungs are clear. Cardiac exam regular rate and rhythm with no murmurs rubs or bruits. Abdomen is soft. Some tenderness to palpation. She has  active bowel sounds. She is no distention. She has no palpable liver or spleen. Extremities shows no clubbing cyanosis or edema. Skin exam shows a macular lesion on the right calf muscle. There is a central eschar. There is no exudate. This is slightly firm. It probably measures about 1.5 cm. Neurological exam shows no focal neurological deficits.   Lab Results  Component Value Date   WBC 10.3 (H) 06/05/2016   HGB 12.4 06/05/2016   HCT 38.9 06/05/2016   MCV 96 06/05/2016   PLT 240 06/05/2016     Chemistry      Component Value Date/Time   NA 139 03/08/2015 1342   K 3.9 03/08/2015 1342   CL 103 03/08/2015 1342   CO2 24 03/08/2015 1342   BUN 17 03/08/2015 1342   CREATININE 0.73  03/08/2015 1342      Component Value Date/Time   CALCIUM 9.5 03/08/2015 1342   ALKPHOS 82 03/08/2015 1342   AST 14 03/08/2015 1342   ALT 13 03/08/2015 1342   BILITOT 0.3 03/08/2015 1342         Impression and Plan: Denise Lawson is 60 year old white female. She has Crohn's disease.She is doing pretty well. Her mom passed away in 05-15-23 I think to a load of stress off of her.  For now, I think we'll probably get her back in 4 months. We'll see the iron studies show. I had completely iron is going be okay given her MCV being a little bit higher.  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 We did watch out with her vitamin D and vitamin B-12 levels also.   Denise Napoleon, MD 11/3/20171:27 PM

## 2016-06-06 LAB — VITAMIN B12: VITAMIN B 12: 231 pg/mL (ref 211–946)

## 2016-06-06 LAB — VITAMIN D 25 HYDROXY (VIT D DEFICIENCY, FRACTURES): Vitamin D, 25-Hydroxy: 19.7 ng/mL — ABNORMAL LOW (ref 30.0–100.0)

## 2016-06-08 LAB — IRON AND TIBC
%SAT: 14 % — ABNORMAL LOW (ref 21–57)
Iron: 40 ug/dL — ABNORMAL LOW (ref 41–142)
TIBC: 283 ug/dL (ref 236–444)
UIBC: 243 ug/dL (ref 120–384)

## 2016-06-08 LAB — FERRITIN: FERRITIN: 744 ng/mL — AB (ref 9–269)

## 2016-06-09 ENCOUNTER — Telehealth: Payer: Self-pay | Admitting: *Deleted

## 2016-06-09 NOTE — Telephone Encounter (Addendum)
Patient aware of results. Message sent to scheduler.   ----- Message from Volanda Napoleon, MD sent at 06/08/2016  1:17 PM EST ----- Call - iron is low!!  Please set her up for IV iron next week.  Thanks!!  Denise Lawson

## 2016-06-15 ENCOUNTER — Ambulatory Visit: Payer: Commercial Managed Care - HMO

## 2016-06-17 ENCOUNTER — Ambulatory Visit (HOSPITAL_BASED_OUTPATIENT_CLINIC_OR_DEPARTMENT_OTHER): Payer: Commercial Managed Care - HMO

## 2016-06-17 ENCOUNTER — Other Ambulatory Visit: Payer: Self-pay | Admitting: *Deleted

## 2016-06-17 DIAGNOSIS — D509 Iron deficiency anemia, unspecified: Secondary | ICD-10-CM | POA: Diagnosis not present

## 2016-06-17 DIAGNOSIS — K909 Intestinal malabsorption, unspecified: Secondary | ICD-10-CM

## 2016-06-17 MED ORDER — SODIUM CHLORIDE 0.9 % IV SOLN
INTRAVENOUS | Status: AC
  Administered 2016-06-17: 15:00:00 via INTRAVENOUS

## 2016-06-17 MED ORDER — SODIUM CHLORIDE 0.9 % IV SOLN
510.0000 mg | Freq: Once | INTRAVENOUS | Status: AC
  Administered 2016-06-17: 510 mg via INTRAVENOUS
  Filled 2016-06-17: qty 17

## 2016-06-17 NOTE — Patient Instructions (Signed)

## 2016-08-12 ENCOUNTER — Other Ambulatory Visit (HOSPITAL_COMMUNITY): Payer: Self-pay | Admitting: *Deleted

## 2016-08-13 ENCOUNTER — Ambulatory Visit (HOSPITAL_COMMUNITY)
Admission: RE | Admit: 2016-08-13 | Discharge: 2016-08-13 | Disposition: A | Discharge status: Home / Self Care | Payer: Commercial Managed Care - HMO | Source: Ambulatory Visit | Attending: Gastroenterology | Admitting: Gastroenterology

## 2016-08-13 MED ORDER — SODIUM CHLORIDE 0.9 % IV SOLN: Freq: Once | INTRAVENOUS | Status: DC

## 2016-08-13 MED ORDER — USTEKINUMAB 130 MG/26ML IV SOLN
260.0000 mg | Freq: Once | INTRAVENOUS | Status: DC
  Filled 2016-08-13: qty 52

## 2016-08-14 ENCOUNTER — Other Ambulatory Visit (HOSPITAL_COMMUNITY): Payer: Self-pay | Admitting: *Deleted

## 2016-08-17 ENCOUNTER — Inpatient Hospital Stay (HOSPITAL_COMMUNITY): Admission: RE | Admit: 2016-08-17 | Payer: Commercial Managed Care - HMO | Source: Ambulatory Visit

## 2016-08-17 ENCOUNTER — Encounter (HOSPITAL_COMMUNITY)
Admission: RE | Admit: 2016-08-17 | Discharge: 2016-08-17 | Disposition: A | Discharge status: Home / Self Care | Payer: Medicare HMO | Source: Ambulatory Visit | Attending: Gastroenterology | Admitting: Gastroenterology

## 2016-08-17 DIAGNOSIS — K5010 Crohn's disease of large intestine without complications: Secondary | ICD-10-CM | POA: Insufficient documentation

## 2016-08-17 MED ORDER — SODIUM CHLORIDE 0.9 % IV SOLN
Freq: Once | INTRAVENOUS | Status: AC
  Administered 2016-08-17: 11:00:00 via INTRAVENOUS

## 2016-08-17 MED ORDER — SODIUM CHLORIDE 0.9 % IV SOLN
260.0000 mg | Freq: Once | INTRAVENOUS | Status: AC
  Administered 2016-08-17: 260 mg via INTRAVENOUS
  Filled 2016-08-17: qty 52

## 2016-09-07 ENCOUNTER — Other Ambulatory Visit (HOSPITAL_BASED_OUTPATIENT_CLINIC_OR_DEPARTMENT_OTHER): Payer: Medicare HMO

## 2016-09-07 ENCOUNTER — Ambulatory Visit (HOSPITAL_BASED_OUTPATIENT_CLINIC_OR_DEPARTMENT_OTHER): Payer: Medicare HMO | Admitting: Hematology & Oncology

## 2016-09-07 VITALS — BP 103/52 | HR 58 | Temp 98.0°F | Wt 101.8 lb

## 2016-09-07 DIAGNOSIS — D510 Vitamin B12 deficiency anemia due to intrinsic factor deficiency: Secondary | ICD-10-CM

## 2016-09-07 DIAGNOSIS — K909 Intestinal malabsorption, unspecified: Secondary | ICD-10-CM

## 2016-09-07 DIAGNOSIS — K50113 Crohn's disease of large intestine with fistula: Secondary | ICD-10-CM

## 2016-09-07 DIAGNOSIS — D509 Iron deficiency anemia, unspecified: Secondary | ICD-10-CM | POA: Diagnosis not present

## 2016-09-07 DIAGNOSIS — K5090 Crohn's disease, unspecified, without complications: Secondary | ICD-10-CM

## 2016-09-07 LAB — CMP (CANCER CENTER ONLY)
ALK PHOS: 80 U/L (ref 26–84)
ALT: 24 U/L (ref 10–47)
AST: 19 U/L (ref 11–38)
Albumin: 3.9 g/dL (ref 3.3–5.5)
BILIRUBIN TOTAL: 0.6 mg/dL (ref 0.20–1.60)
BUN: 14 mg/dL (ref 7–22)
CALCIUM: 9.5 mg/dL (ref 8.0–10.3)
CO2: 27 mEq/L (ref 18–33)
Chloride: 105 mEq/L (ref 98–108)
Creat: 0.8 mg/dl (ref 0.6–1.2)
Glucose, Bld: 87 mg/dL (ref 73–118)
POTASSIUM: 3.4 meq/L (ref 3.3–4.7)
Sodium: 143 mEq/L (ref 128–145)
TOTAL PROTEIN: 7.7 g/dL (ref 6.4–8.1)

## 2016-09-07 LAB — CBC WITH DIFFERENTIAL (CANCER CENTER ONLY)
BASO#: 0 10*3/uL (ref 0.0–0.2)
BASO%: 0.4 % (ref 0.0–2.0)
EOS%: 2.2 % (ref 0.0–7.0)
Eosinophils Absolute: 0.2 10*3/uL (ref 0.0–0.5)
HEMATOCRIT: 42.2 % (ref 34.8–46.6)
HGB: 13.5 g/dL (ref 11.6–15.9)
LYMPH#: 2 10*3/uL (ref 0.9–3.3)
LYMPH%: 23 % (ref 14.0–48.0)
MCH: 30.8 pg (ref 26.0–34.0)
MCHC: 32 g/dL (ref 32.0–36.0)
MCV: 96 fL (ref 81–101)
MONO#: 0.3 10*3/uL (ref 0.1–0.9)
MONO%: 4 % (ref 0.0–13.0)
NEUT#: 6 10*3/uL (ref 1.5–6.5)
NEUT%: 70.4 % (ref 39.6–80.0)
PLATELETS: 199 10*3/uL (ref 145–400)
RBC: 4.39 10*6/uL (ref 3.70–5.32)
RDW: 14.3 % (ref 11.1–15.7)
WBC: 8.5 10*3/uL (ref 3.9–10.0)

## 2016-09-07 LAB — IRON AND TIBC
%SAT: 40 % (ref 21–57)
Iron: 125 ug/dL (ref 41–142)
TIBC: 309 ug/dL (ref 236–444)
UIBC: 184 ug/dL (ref 120–384)

## 2016-09-07 LAB — FERRITIN

## 2016-09-07 NOTE — Progress Notes (Signed)
Hematology and Oncology Follow Up Visit  Denise Lawson [id number] April 15, 1956 61 y.o. 09/07/2016   Principle Diagnosis:   Recurrent iron deficiency anemia  Pernicious anemia  Crohn's disease-exacerbation  Current Therapy:    IV iron as indicated - last dose given 06/17/2016  Vitamin B12 1 mg IM every month     Interim History:  Ms.  Lawson is back for followup. She had a very nice Christmas. She just got back from their [place]. She made it through the freezing rain yesterday. Thankfully, there was no accidents.  She has had no issues with flareups of her inflammatory bowel disease. She now is on for the inflammatory bowel disease. She just started taking this.   She got iron back in November. At that point in time, her ferritin was 744 with an iron saturation of only 14%. Her serum iron was 40.   She has had no problems with bowels or bladder. She's had no cough. She's had no rashes. She's had no fever. Thankfully, she has made through the flu season without any infections.   Overall, her performance status is ECOG 1.   Medications:  Current Outpatient Prescriptions:  .  cyanocobalamin (,VITAMIN B-12,) 1000 MCG/ML injection, Inject 1 mL (1,000 mcg total) into the muscle every 30 (thirty) days. Every Friday (For 3 Months), Disp: 1 mL, Rfl: 5 .  dimenhyDRINATE (DRAMAMINE) 50 MG tablet, Take 50 mg by mouth as needed. , Disp: , Rfl:  .  ergocalciferol (VITAMIN D2) 50000 units capsule, Take 1 capsule (50,000 Units total) by mouth once a week. (Patient taking differently: Take 50,000 Units by mouth 2 (two) times a week. ), Disp: 12 capsule, Rfl: 3 .  ibuprofen (ADVIL,MOTRIN) 200 MG tablet, Take 400 mg by mouth every 6 (six) hours as needed for pain. , Disp: , Rfl:  .  nitrofurantoin (MACRODANTIN) 50 MG capsule, Take 50 mg by mouth daily., Disp: , Rfl:  .  Promethazine HCl (PHENERGAN PO), Take 25 mg by mouth as needed. Reported on 11/27/2015, Disp: , Rfl:  No current  facility-administered medications for this visit.   Facility-Administered Medications Ordered in Other Visits:  .  0.9 %  sodium chloride infusion, , Intravenous, Continuous, Volanda Napoleon, MD, Stopped at 06/17/16 1635  Allergies:  Allergies  Allergen Reactions  . Darvon [Propoxyphene Hcl]     Hives  . Tamiflu  [Oseltamivir] Rash  . Iohexol      Desc: [facility]10 YRS [facility]GO [facility]   Marland Kitchen Morphine And Related Other (See Comments)    Past Medical History, Surgical history, Social history, and Family History were reviewed and updated.  Review of Systems: As above  Physical Exam:  weight is 101 lb 12.8 oz (46.2 kg). Her oral temperature is 98 F (36.7 C). Her blood pressure is 103/52 (abnormal) and her pulse is 58 (abnormal).   Thin white female. Her head and exam shows no ocular or oral lesion. She is no palpable cervical or supraclavicular lymph nodes. Lungs are clear. Cardiac exam regular rate and rhythm with no murmurs rubs or bruits. Abdomen is soft. Some tenderness to palpation. She has active bowel sounds. She is no distention. She has no palpable liver or spleen. Extremities shows no clubbing cyanosis or edema. Skin exam shows a macular lesion on the right calf muscle. There is a central eschar. There is no exudate. This is slightly firm. It probably measures about 1.5 cm. Neurological exam shows no focal neurological deficits.   Lab Results  Component Value Date   WBC 8.5 09/07/2016   HGB 13.5 09/07/2016   HCT 42.2 09/07/2016   MCV 96 09/07/2016   PLT 199 09/07/2016     Chemistry      Component Value Date/Time   NA 143 09/07/2016 1151   NA 143 06/05/2016 1156   K 3.4 09/07/2016 1151   K 4.0 06/05/2016 1156   CL 105 09/07/2016 1151   CO2 27 09/07/2016 1151   CO2 25 06/05/2016 1156   BUN 14 09/07/2016 1151   BUN 10.5 06/05/2016 1156   CREATININE 0.8 09/07/2016 1151   CREATININE 0.8 06/05/2016 1156      Component Value Date/Time   CALCIUM 9.5 09/07/2016  1151   CALCIUM 9.5 06/05/2016 1156   ALKPHOS 80 09/07/2016 1151   ALKPHOS 107 06/05/2016 1156   AST 19 09/07/2016 1151   AST 13 06/05/2016 1156   ALT 24 09/07/2016 1151   ALT 12 06/05/2016 1156   BILITOT 0.60 09/07/2016 1151   BILITOT 0.24 06/05/2016 1156         Impression and Plan: Denise Lawson is 61 year old white female. She has Crohn's disease.She is doing pretty well. Her mom passed away in 05/01/2023. I think that this took a load of stress off of her.  For now, I think we'll probably get her back in 4 months. We'll see the iron studies show. I would like to think that her iron is going be okay given her MCV being a stable.  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   Volanda Napoleon, MD 2/5/20181:31 PM

## 2016-09-08 ENCOUNTER — Telehealth: Payer: Self-pay | Admitting: *Deleted

## 2016-09-08 LAB — VITAMIN B12: Vitamin B12: 258 pg/mL (ref 232–1245)

## 2016-09-08 NOTE — Telephone Encounter (Addendum)
Patient aware of results  ----- Message from Volanda Napoleon, MD sent at 09/07/2016  5:16 PM EST ----- Call - iron studies are ok!!!  Demetrios Isaacs!!  Laurey Arrow

## 2016-12-07 ENCOUNTER — Other Ambulatory Visit: Payer: Self-pay | Admitting: Family

## 2016-12-07 ENCOUNTER — Ambulatory Visit (HOSPITAL_BASED_OUTPATIENT_CLINIC_OR_DEPARTMENT_OTHER): Payer: Medicare HMO | Admitting: Family

## 2016-12-07 ENCOUNTER — Other Ambulatory Visit: Payer: Self-pay | Admitting: *Deleted

## 2016-12-07 ENCOUNTER — Other Ambulatory Visit (HOSPITAL_BASED_OUTPATIENT_CLINIC_OR_DEPARTMENT_OTHER): Payer: Medicare HMO

## 2016-12-07 DIAGNOSIS — K50913 Crohn's disease, unspecified, with fistula: Secondary | ICD-10-CM | POA: Diagnosis not present

## 2016-12-07 DIAGNOSIS — D510 Vitamin B12 deficiency anemia due to intrinsic factor deficiency: Secondary | ICD-10-CM

## 2016-12-07 DIAGNOSIS — K50113 Crohn's disease of large intestine with fistula: Secondary | ICD-10-CM

## 2016-12-07 DIAGNOSIS — E559 Vitamin D deficiency, unspecified: Secondary | ICD-10-CM

## 2016-12-07 DIAGNOSIS — D509 Iron deficiency anemia, unspecified: Secondary | ICD-10-CM | POA: Diagnosis not present

## 2016-12-07 DIAGNOSIS — K909 Intestinal malabsorption, unspecified: Secondary | ICD-10-CM

## 2016-12-07 LAB — CBC WITH DIFFERENTIAL (CANCER CENTER ONLY)
BASO#: 0 10*3/uL (ref 0.0–0.2)
BASO%: 0.2 % (ref 0.0–2.0)
EOS%: 2.4 % (ref 0.0–7.0)
Eosinophils Absolute: 0.3 10*3/uL (ref 0.0–0.5)
HEMATOCRIT: 44.2 % (ref 34.8–46.6)
HEMOGLOBIN: 14.5 g/dL (ref 11.6–15.9)
LYMPH#: 1.7 10*3/uL (ref 0.9–3.3)
LYMPH%: 15.1 % (ref 14.0–48.0)
MCH: 31.5 pg (ref 26.0–34.0)
MCHC: 32.8 g/dL (ref 32.0–36.0)
MCV: 96 fL (ref 81–101)
MONO#: 0.5 10*3/uL (ref 0.1–0.9)
MONO%: 4.2 % (ref 0.0–13.0)
NEUT#: 8.6 10*3/uL — ABNORMAL HIGH (ref 1.5–6.5)
NEUT%: 78.1 % (ref 39.6–80.0)
Platelets: 235 10*3/uL (ref 145–400)
RBC: 4.61 10*6/uL (ref 3.70–5.32)
RDW: 13.7 % (ref 11.1–15.7)
WBC: 11.1 10*3/uL — ABNORMAL HIGH (ref 3.9–10.0)

## 2016-12-07 LAB — IRON AND TIBC
%SAT: 33 % (ref 21–57)
IRON: 98 ug/dL (ref 41–142)
TIBC: 293 ug/dL (ref 236–444)
UIBC: 195 ug/dL (ref 120–384)

## 2016-12-07 LAB — COMPREHENSIVE METABOLIC PANEL
ALBUMIN: 4 g/dL (ref 3.5–5.0)
ALK PHOS: 99 U/L (ref 40–150)
ALT: 18 U/L (ref 0–55)
ANION GAP: 10 meq/L (ref 3–11)
AST: 16 U/L (ref 5–34)
BILIRUBIN TOTAL: 0.6 mg/dL (ref 0.20–1.20)
BUN: 8 mg/dL (ref 7.0–26.0)
CO2: 26 mEq/L (ref 22–29)
Calcium: 10 mg/dL (ref 8.4–10.4)
Chloride: 105 mEq/L (ref 98–109)
Creatinine: 0.7 mg/dL (ref 0.6–1.1)
EGFR: 88 mL/min/{1.73_m2} — ABNORMAL LOW (ref >90)
GLUCOSE: 87 mg/dL (ref 70–140)
POTASSIUM: 4 meq/L (ref 3.5–5.1)
SODIUM: 140 meq/L (ref 136–145)
Total Protein: 8 g/dL (ref 6.4–8.3)

## 2016-12-07 LAB — FERRITIN

## 2016-12-07 MED ORDER — CYANOCOBALAMIN 1000 MCG/ML IJ SOLN: 1000.0000 ug | INTRAMUSCULAR | 5 refills | Status: DC

## 2016-12-07 NOTE — Progress Notes (Signed)
Hematology and Oncology Follow Up Visit  Denise Lawson [id number] 07-23-1956 61 y.o. 12/07/2016   Principle Diagnosis:  Recurrent iron deficiency anemia Pernicious anemia Crohn's disease-exacerbation  Current Therapy:   IV iron as indicated - last received in November 2017 B12 1,000 mcg IM monthly - self    Interim History:  Denise Lawson is here today for follow-up. She is doing fairly well. Unfortunately, after receiving Stelara in January she has had several flares with her Crohn's disease and diarrhea. She also had a stomach virus last week. Thankfully her counts were not effected. Her iron studies and Hgb have remained stable. She does not need iron at this time.  She has occasional fatigued and "light headedness." No fever, chills, n/v, cough, rash, dizziness, SOB, chest pain, palpitations or changes in bladder habits.  No episodes of bleeding, bruising or petechiae. No lymphadenopathy found on exam.  No swelling, tenderness, numbness or tingling in her extremities.  Her appetite comes and goes due to abdominal cramping with her flares. She is staying hydrated. Her weight is stable.   ECOG Performance Status: 1 - Symptomatic but completely ambulatory  Medications:  Allergies as of 12/07/2016      Reactions   Darvon [propoxyphene Hcl]    Hives   Tamiflu  [oseltamivir] Rash   Iohexol     Desc: VOMITING-10 YRS AGO [facility]   Morphine And Related Other (See Comments)      Medication List       Accurate as of 12/07/16 11:56 AM. Always use your most recent med list.          cyanocobalamin 1000 MCG/ML injection Commonly known as:  (VITAMIN B-12) Inject 1 mL (1,000 mcg total) into the muscle every 30 (thirty) days. Every Friday (For 3 Months)   dimenhyDRINATE 50 MG tablet Commonly known as:  DRAMAMINE Take 50 mg by mouth as needed.   ergocalciferol 50000 units capsule Commonly known as:  VITAMIN D2 Take 1 capsule (50,000 Units total) by mouth once a week.     ibuprofen 200 MG tablet Commonly known as:  ADVIL,MOTRIN Take 400 mg by mouth every 6 (six) hours as needed for pain.   nitrofurantoin 50 MG capsule Commonly known as:  MACRODANTIN Take 50 mg by mouth daily.   PHENERGAN PO Take 25 mg by mouth as needed. Reported on 11/27/2015       Allergies:  Allergies  Allergen Reactions  . Darvon [Propoxyphene Hcl]     Hives  . Tamiflu  [Oseltamivir] Rash  . Iohexol      Desc: VOMITING-10 YRS AGO [facility]   Marland Kitchen Morphine And Related Other (See Comments)    Past Medical History, Surgical history, Social history, and Family History were reviewed and updated.  Review of Systems: All other 10 point review of systems is negative.   Physical Exam:  vitals were not taken for this visit.  Wt Readings from Last 3 Encounters:  09/07/16 101 lb 12.8 oz (46.2 kg)  08/17/16 99 lb (44.9 kg)  08/13/16 99 lb (44.9 kg)    Ocular: Sclerae unicteric, pupils equal, round and reactive to light Ear-nose-throat: Oropharynx clear, dentition fair Lymphatic: No cervical, supraclavicular or axillary adenopathy Lungs no rales or rhonchi, good excursion bilaterally Heart regular rate and rhythm, no murmur appreciated Abd soft, nontender, positive bowel sounds, no liver or spleen tip palpated on exam, no fluid wave  MSK no focal spinal tenderness, no joint edema Neuro: non-focal, well-oriented, appropriate affect Breasts: Deferred  Lab Results  Component Value Date   WBC 11.1 (H) 12/07/2016   HGB 14.5 12/07/2016   HCT 44.2 12/07/2016   MCV 96 12/07/2016   PLT 235 12/07/2016   Lab Results  Component Value Date   FERRITIN 1,135 (H) 09/07/2016   IRON 125 09/07/2016   TIBC 309 09/07/2016   UIBC 184 09/07/2016   IRONPCTSAT 40 09/07/2016   Lab Results  Component Value Date   RETICCTPCT 1.2 12/25/2011   RBC 4.61 12/07/2016   RETICCTABS 49.8 12/25/2011   No results found for: KPAFRELGTCHN, LAMBDASER, KAPLAMBRATIO No results found for:  IGGSERUM, IGA, IGMSERUM No results found for: Marda Stalker, SPEI   Chemistry      Component Value Date/Time   NA 143 09/07/2016 1151   NA 143 06/05/2016 1156   K 3.4 09/07/2016 1151   K 4.0 06/05/2016 1156   CL 105 09/07/2016 1151   CO2 27 09/07/2016 1151   CO2 25 06/05/2016 1156   BUN 14 09/07/2016 1151   BUN 10.5 06/05/2016 1156   CREATININE 0.8 09/07/2016 1151   CREATININE 0.8 06/05/2016 1156      Component Value Date/Time   CALCIUM 9.5 09/07/2016 1151   CALCIUM 9.5 06/05/2016 1156   ALKPHOS 80 09/07/2016 1151   ALKPHOS 107 06/05/2016 1156   AST 19 09/07/2016 1151   AST 13 06/05/2016 1156   ALT 24 09/07/2016 1151   ALT 12 06/05/2016 1156   BILITOT 0.60 09/07/2016 1151   BILITOT 0.24 06/05/2016 1156      Impression and Plan: Denise Lawson is a very pleasant 61 yo caucasian female with multifactorial anemia (pernicious and iron deficiency) 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 Despite having several flares with CD lately, her iron studies have remained stable and she will not need an infusion at this time.  Vit D and B12 levels are pending. B 12 was refilled today.  I spent 15 minutes face to face with > 50 % of that time counseling and coordinating care.  We will plan to see her back in 3 months for repeat lab work and follow-up.  She will contact our office with any questions or concerns. We can certainly see her sooner if need be.   Verdie Mosher, NP 5/7/201811:56 AM

## 2016-12-08 ENCOUNTER — Other Ambulatory Visit: Payer: Self-pay | Admitting: Family

## 2016-12-08 ENCOUNTER — Telehealth: Payer: Self-pay | Admitting: *Deleted

## 2016-12-08 DIAGNOSIS — D510 Vitamin B12 deficiency anemia due to intrinsic factor deficiency: Secondary | ICD-10-CM

## 2016-12-08 DIAGNOSIS — E559 Vitamin D deficiency, unspecified: Secondary | ICD-10-CM

## 2016-12-08 LAB — SOLUBLE TRANSFERRIN RECEPTOR: Transferrin Receptor: 8 nmol/L — ABNORMAL LOW (ref 12.2–27.3)

## 2016-12-08 LAB — VITAMIN B12: VITAMIN B 12: 262 pg/mL (ref 232–1245)

## 2016-12-08 LAB — VITAMIN D 25 HYDROXY (VIT D DEFICIENCY, FRACTURES): VIT D 25 HYDROXY: 21.3 ng/mL — AB (ref 30.0–100.0)

## 2016-12-08 MED ORDER — ERGOCALCIFEROL 1.25 MG (50000 UT) PO CAPS: 50000.0000 [IU] | ORAL_CAPSULE | ORAL | 6 refills | Status: DC

## 2016-12-08 NOTE — Telephone Encounter (Addendum)
Patient aware of results  ----- Message from Verdie Mosher, NP sent at 12/08/2016 10:42 AM EDT ----- Iron studies look great no infusion needed! Keep taking Vit D, still low! Scripts refilled for B 12 and Vit D. Thank you!   Sarah  ----- Message ----- From: Josph Macho, MD Sent: 12/08/2016   9:18 AM To: Verdie Mosher, NP    ----- Message ----- From: Interface, [facility] Sent: 12/07/2016  11:51 AM To: Josph Macho, MD

## 2017-03-08 ENCOUNTER — Other Ambulatory Visit (HOSPITAL_BASED_OUTPATIENT_CLINIC_OR_DEPARTMENT_OTHER): Payer: Medicare HMO

## 2017-03-08 ENCOUNTER — Ambulatory Visit (HOSPITAL_BASED_OUTPATIENT_CLINIC_OR_DEPARTMENT_OTHER): Payer: Medicare HMO | Admitting: Family

## 2017-03-08 VITALS — BP 124/59 | HR 65 | Temp 98.3°F | Resp 16 | Wt 104.0 lb

## 2017-03-08 DIAGNOSIS — D510 Vitamin B12 deficiency anemia due to intrinsic factor deficiency: Secondary | ICD-10-CM | POA: Diagnosis not present

## 2017-03-08 DIAGNOSIS — D509 Iron deficiency anemia, unspecified: Secondary | ICD-10-CM

## 2017-03-08 DIAGNOSIS — K5090 Crohn's disease, unspecified, without complications: Secondary | ICD-10-CM

## 2017-03-08 DIAGNOSIS — K909 Intestinal malabsorption, unspecified: Secondary | ICD-10-CM

## 2017-03-08 DIAGNOSIS — E559 Vitamin D deficiency, unspecified: Secondary | ICD-10-CM

## 2017-03-08 DIAGNOSIS — K50113 Crohn's disease of large intestine with fistula: Secondary | ICD-10-CM

## 2017-03-08 LAB — CBC WITH DIFFERENTIAL (CANCER CENTER ONLY)
BASO#: 0 10*3/uL (ref 0.0–0.2)
BASO%: 0.2 % (ref 0.0–2.0)
EOS%: 2.2 % (ref 0.0–7.0)
Eosinophils Absolute: 0.2 10*3/uL (ref 0.0–0.5)
HEMATOCRIT: 39.4 % (ref 34.8–46.6)
HGB: 12.7 g/dL (ref 11.6–15.9)
LYMPH#: 1.8 10*3/uL (ref 0.9–3.3)
LYMPH%: 17.1 % (ref 14.0–48.0)
MCH: 31 pg (ref 26.0–34.0)
MCHC: 32.2 g/dL (ref 32.0–36.0)
MCV: 96 fL (ref 81–101)
MONO#: 0.4 10*3/uL (ref 0.1–0.9)
MONO%: 4.3 % (ref 0.0–13.0)
NEUT#: 7.8 10*3/uL — ABNORMAL HIGH (ref 1.5–6.5)
NEUT%: 76.2 % (ref 39.6–80.0)
Platelets: 229 10*3/uL (ref 145–400)
RBC: 4.1 10*6/uL (ref 3.70–5.32)
RDW: 13.4 % (ref 11.1–15.7)
WBC: 10.3 10*3/uL — ABNORMAL HIGH (ref 3.9–10.0)

## 2017-03-08 LAB — COMPREHENSIVE METABOLIC PANEL
ALT: 14 U/L (ref 0–55)
ANION GAP: 8 meq/L (ref 3–11)
AST: 15 U/L (ref 5–34)
Albumin: 3.6 g/dL (ref 3.5–5.0)
Alkaline Phosphatase: 89 U/L (ref 40–150)
BUN: 12.4 mg/dL (ref 7.0–26.0)
CHLORIDE: 106 meq/L (ref 98–109)
CO2: 27 mEq/L (ref 22–29)
CREATININE: 0.8 mg/dL (ref 0.6–1.1)
Calcium: 9.8 mg/dL (ref 8.4–10.4)
EGFR: 85 mL/min/{1.73_m2} — ABNORMAL LOW (ref >90)
Glucose: 89 mg/dl (ref 70–140)
POTASSIUM: 4.2 meq/L (ref 3.5–5.1)
Sodium: 141 mEq/L (ref 136–145)
Total Bilirubin: 0.31 mg/dL (ref 0.20–1.20)
Total Protein: 7.3 g/dL (ref 6.4–8.3)

## 2017-03-08 LAB — IRON AND TIBC
%SAT: 20 % — AB (ref 21–57)
IRON: 60 ug/dL (ref 41–142)
TIBC: 299 ug/dL (ref 236–444)
UIBC: 239 ug/dL (ref 120–384)

## 2017-03-08 LAB — FERRITIN: Ferritin: 753 ng/mL — ABNORMAL HIGH (ref 9–269)

## 2017-03-08 NOTE — Progress Notes (Signed)
Hematology and Oncology Follow Up Visit  Denise Lawson [id number] 06-17-1956 61 y.o. 03/08/2017   Principle Diagnosis:  Recurrent iron deficiency anemia Pernicious anemia Crohn's disease-exacerbation  Current Therapy:   IV iron as indicated - last received in November 2017 B12 1,000 mcg IM monthly - self    Interim History:  Denise Lawson is here today for follow-up. She has had several flares and abdominal spasms with her Crohn's disease. She states that she has not noticed much blood loss.  She is avoiding foods that are triggers and has used CBD oil. This has helped with her abdominal spasms quite a bit.  She has maintained a good appetite and is staying well hydrated. Her weight is up another 2 lbs since her last visit.  She has had some fatigue and has needed more sleep than usual. She is also craving sugar which is another sign she has of iron deficiency.  She verbalized that she is taking her vitamin D supplement daily and self injecting B 12 once a month at home.  No fever, chills, n/v, cough, rash, dizziness, SOB, chest pain, palpitations or changes in bowel or bladder habits.  No lymphadenopathy found on exam. No bruising or petechiae.  No swelling, tenderness, numbness or tingling in her extremities. No c/o pain at this time.   ECOG Performance Status: 1 - Symptomatic but completely ambulatory  Medications:  Allergies as of 03/08/2017      Reactions   Darvon [propoxyphene Hcl]    Hives   Tamiflu  [oseltamivir] Rash   Iohexol     Desc: VOM[facility]-10 YRS AGO [facility]   Morphine And Related Other (See Comments)      Medication List       Accurate as of 03/08/17 12:02 PM. Always use your most recent med list.          amantadine 100 MG capsule Commonly known as:  SYMMETREL Take by mouth.   CALCIUM 1200+D3 PO Take by mouth.   cyanocobalamin 1000 MCG/ML injection Commonly known as:  (VITAMIN B-12) Inject 1 mL (1,000 mcg total) into the muscle every 30  (thirty) days.   dimenhyDRINATE 50 MG tablet Commonly known as:  DRAMAMINE Take 50 mg by mouth as needed.   ergocalciferol 50000 units capsule Commonly known as:  VITAMIN D2 Take 1 capsule (50,000 Units total) by mouth 2 (two) times a week.   ibuprofen 200 MG tablet Commonly known as:  ADVIL,MOTRIN Take 400 mg by mouth every 6 (six) hours as needed for pain.   nitrofurantoin 50 MG capsule Commonly known as:  MACRODANTIN Take 50 mg by mouth daily.   PHENERGAN PO Take 25 mg by mouth as needed. Reported on 11/27/2015       Allergies:  Allergies  Allergen Reactions  . Darvon [Propoxyphene Hcl]     Hives  . Tamiflu  [Oseltamivir] Rash  . Iohexol      Desc: VOM[facility]-10 YRS [facility]GO [facility]   Marland Kitchen Morphine And Related Other (See Comments)    Past Medical History, Surgical history, Social history, and Family History were reviewed and updated.  Review of Systems: All other 10 point review of systems is negative.   Physical Exam:  weight is 104 lb (47.2 kg). Her oral temperature is 98.3 F (36.8 C). Her blood pressure is 124/59 (abnormal) and her pulse is 65. Her respiration is 16 and oxygen saturation is 100%.   Wt Readings from Last 3 Encounters:  03/08/17 104 lb (47.2 kg)  12/07/16 102 lb (46.3 kg)  09/07/16 101 lb 12.8 oz (46.2 kg)    Ocular: Sclerae unicteric, pupils equal, round and reactive to light Ear-nose-throat: Oropharynx clear, dentition fair Lymphatic: No cervical, supraclavicular or axillary adenopathy Lungs no rales or rhonchi, good excursion bilaterally Heart regular rate and rhythm, no murmur appreciated Abd soft, nontender, positive bowel sounds, no liver or spleen tip palpated on exam, no fluid wave MSK no focal spinal tenderness, no joint edema Neuro: non-focal, well-oriented, appropriate affect Breasts: Deferred   Lab Results  Component Value Date   WBC 10.3 (H) 03/08/2017   HGB 12.7 03/08/2017   HCT 39.4 03/08/2017   MCV 96 03/08/2017     PLT 229 03/08/2017   Lab Results  Component Value Date   FERRITIN 1,039 (H) 12/07/2016   IRON 98 12/07/2016   TIBC 293 12/07/2016   UIBC 195 12/07/2016   IRONPCTSAT 33 12/07/2016   Lab Results  Component Value Date   RETICCTPCT 1.2 12/25/2011   RBC 4.10 03/08/2017   RETICCTABS 49.8 12/25/2011   No results found for: KPAFRELGTCHN, LAMBDASER, KAPLAMBRATIO No results found for: IGGSERUM, IGA, IGMSERUM No results found for: Ronnald Ramp, A1GS, A2GS, Violet Baldy, MSPIKE, SPEI   Chemistry      Component Value Date/Time   NA 140 12/07/2016 1136   K 4.0 12/07/2016 1136   CL 105 09/07/2016 1151   CO2 26 12/07/2016 1136   BUN 8.0 12/07/2016 1136   CREATININE 0.7 12/07/2016 1136      Component Value Date/Time   CALCIUM 10.0 12/07/2016 1136   ALKPHOS 99 12/07/2016 1136   AST 16 12/07/2016 1136   ALT 18 12/07/2016 1136   BILITOT 0.60 12/07/2016 1136      Impression and Plan: Denise Lawson is a very pleasant 61 yo caucasian female with multifactorial anemia (pernicious and iron deficiency) secondary to malabsorption due to Crohn's disease. She is symptomatic at this time with fatigue and craving sugar.  She has had several recent flares with her CD. Her Hgb is down a little to 12.7.  We will see what her iron studies show and bring her back in later this week for infusion if needed.  We will go ahead and plan to see her back again in another 3 months for repeat lab work and follow-up.  She will contact our office with any questions or concerns. We can certainly see her sooner if need be.   Eliezer Bottom, NP 8/6/201812:02 PM

## 2017-03-09 ENCOUNTER — Telehealth: Payer: Self-pay | Admitting: *Deleted

## 2017-03-09 LAB — VITAMIN B12: Vitamin B12: 216 pg/mL — ABNORMAL LOW (ref 232–1245)

## 2017-03-09 LAB — VITAMIN D 25 HYDROXY (VIT D DEFICIENCY, FRACTURES): VIT D 25 HYDROXY: 21.3 ng/mL — AB (ref 30.0–100.0)

## 2017-03-09 NOTE — Telephone Encounter (Addendum)
Patient is aware of results. Appointment made.   ----- Message from Eliezer Bottom, NP sent at 03/08/2017  4:01 PM EDT ----- Regarding: Iron  She will need IV iron later this week please. LOS sent to [facility] Thank you!  Sarah  ----- Message ----- From: Interface, Lab In [facility] Sent: 03/08/2017  11:37 AM To: Eliezer Bottom, NP

## 2017-03-11 ENCOUNTER — Ambulatory Visit (HOSPITAL_BASED_OUTPATIENT_CLINIC_OR_DEPARTMENT_OTHER): Payer: Medicare HMO

## 2017-03-11 ENCOUNTER — Other Ambulatory Visit: Payer: Self-pay | Admitting: Family

## 2017-03-11 VITALS — BP 118/63 | HR 61 | Temp 97.8°F | Resp 18

## 2017-03-11 DIAGNOSIS — D510 Vitamin B12 deficiency anemia due to intrinsic factor deficiency: Secondary | ICD-10-CM | POA: Diagnosis not present

## 2017-03-11 DIAGNOSIS — D509 Iron deficiency anemia, unspecified: Secondary | ICD-10-CM | POA: Diagnosis not present

## 2017-03-11 MED ORDER — CYANOCOBALAMIN 1000 MCG/ML IJ SOLN
INTRAMUSCULAR | Status: AC
  Filled 2017-03-11: qty 1

## 2017-03-11 MED ORDER — SODIUM CHLORIDE 0.9 % IV SOLN
Freq: Once | INTRAVENOUS | Status: AC
  Administered 2017-03-11: 10:00:00 via INTRAVENOUS

## 2017-03-11 MED ORDER — CYANOCOBALAMIN 1000 MCG/ML IJ SOLN
1000.0000 ug | Freq: Once | INTRAMUSCULAR | Status: AC
  Administered 2017-03-11: 1000 ug via INTRAMUSCULAR

## 2017-03-11 MED ORDER — SODIUM CHLORIDE 0.9 % IV SOLN
510.0000 mg | Freq: Once | INTRAVENOUS | Status: AC
  Administered 2017-03-11: 510 mg via INTRAVENOUS
  Filled 2017-03-11: qty 17

## 2017-03-11 NOTE — Patient Instructions (Signed)

## 2017-06-14 ENCOUNTER — Encounter: Payer: Self-pay | Admitting: Family

## 2017-06-14 ENCOUNTER — Ambulatory Visit (HOSPITAL_BASED_OUTPATIENT_CLINIC_OR_DEPARTMENT_OTHER): Payer: Medicare HMO | Admitting: Family

## 2017-06-14 ENCOUNTER — Other Ambulatory Visit: Payer: Self-pay

## 2017-06-14 ENCOUNTER — Other Ambulatory Visit: Payer: Medicare HMO

## 2017-06-14 VITALS — BP 118/65 | HR 71 | Temp 98.2°F | Resp 18 | Wt 105.0 lb

## 2017-06-14 DIAGNOSIS — D509 Iron deficiency anemia, unspecified: Secondary | ICD-10-CM

## 2017-06-14 DIAGNOSIS — D510 Vitamin B12 deficiency anemia due to intrinsic factor deficiency: Secondary | ICD-10-CM

## 2017-06-14 DIAGNOSIS — E559 Vitamin D deficiency, unspecified: Secondary | ICD-10-CM

## 2017-06-14 DIAGNOSIS — K909 Intestinal malabsorption, unspecified: Secondary | ICD-10-CM | POA: Diagnosis not present

## 2017-06-14 DIAGNOSIS — D500 Iron deficiency anemia secondary to blood loss (chronic): Secondary | ICD-10-CM | POA: Diagnosis not present

## 2017-06-14 DIAGNOSIS — K5090 Crohn's disease, unspecified, without complications: Secondary | ICD-10-CM | POA: Diagnosis not present

## 2017-06-14 LAB — CBC WITH DIFFERENTIAL (CANCER CENTER ONLY)
BASO#: 0 10*3/uL (ref 0.0–0.2)
BASO%: 0.2 % (ref 0.0–2.0)
EOS ABS: 0.2 10*3/uL (ref 0.0–0.5)
EOS%: 2.1 % (ref 0.0–7.0)
HCT: 40.4 % (ref 34.8–46.6)
HGB: 12.6 g/dL (ref 11.6–15.9)
LYMPH#: 1.9 10*3/uL (ref 0.9–3.3)
LYMPH%: 18.9 % (ref 14.0–48.0)
MCH: 30.1 pg (ref 26.0–34.0)
MCHC: 31.2 g/dL — AB (ref 32.0–36.0)
MCV: 97 fL (ref 81–101)
MONO#: 0.4 10*3/uL (ref 0.1–0.9)
MONO%: 4.1 % (ref 0.0–13.0)
NEUT#: 7.4 10*3/uL — ABNORMAL HIGH (ref 1.5–6.5)
NEUT%: 74.7 % (ref 39.6–80.0)
PLATELETS: 281 10*3/uL (ref 145–400)
RBC: 4.18 10*6/uL (ref 3.70–5.32)
RDW: 13.8 % (ref 11.1–15.7)
WBC: 9.9 10*3/uL (ref 3.9–10.0)

## 2017-06-14 LAB — IRON AND TIBC
%SAT: 16 % — AB (ref 21–57)
Iron: 46 ug/dL (ref 41–142)
TIBC: 289 ug/dL (ref 236–444)
UIBC: 243 ug/dL (ref 120–384)

## 2017-06-14 LAB — COMPREHENSIVE METABOLIC PANEL
ALT: 14 U/L (ref 0–55)
AST: 15 U/L (ref 5–34)
Albumin: 3.5 g/dL (ref 3.5–5.0)
Alkaline Phosphatase: 91 U/L (ref 40–150)
Anion Gap: 10 mEq/L (ref 3–11)
BUN: 6.8 mg/dL — AB (ref 7.0–26.0)
CALCIUM: 9.6 mg/dL (ref 8.4–10.4)
CHLORIDE: 103 meq/L (ref 98–109)
CO2: 26 meq/L (ref 22–29)
Creatinine: 0.8 mg/dL (ref 0.6–1.1)
EGFR: >60 mL/min/{1.73_m2} (ref >60)
Glucose: 83 mg/dl (ref 70–140)
Potassium: 3.9 mEq/L (ref 3.5–5.1)
Sodium: 139 mEq/L (ref 136–145)
TOTAL PROTEIN: 7.6 g/dL (ref 6.4–8.3)
Total Bilirubin: 0.4 mg/dL (ref 0.20–1.20)

## 2017-06-14 LAB — FERRITIN: Ferritin: 874 ng/ml — ABNORMAL HIGH (ref 9–269)

## 2017-06-14 NOTE — Progress Notes (Signed)
Hematology and Oncology Follow Up Visit  Cammy CopaCarol C Bicknell [id number] 06/05/1956 61 y.o. 06/14/2017   Principle Diagnosis:  Recurrent iron deficiency anemia Pernicious anemia Crohn's disease-exacerbation  Current Therapy:   IV iron as indicated - last received in November 2017 B12 1,000 mcg IM monthly - self    Interim History:  Ms. Aundria RudRogers is here today for follow-up. She has had several flares with her Crohn's and is managing this with her diet. She is currently avoiding red meat.  She has not been craving sugar which she states is normally a sign that her iron is low.  She denies any episodes of bleeding, no bruising or petechiae. No lymphadenopathy found on exam.  No swelling, tenderness, numbness or tingling in her extremities. No c/o pain.  She has a good appetite and is staying hydrated. Her weight is stable.  She is going to the gym again and working on building up her strength.   ECOG Performance Status: 1 - Symptomatic but completely ambulatory  Medications:  Allergies as of 06/14/2017      Reactions   Azithromycin Itching, Rash   Darvon [propoxyphene Hcl]    Hives   Morphine And Related Other (See Comments)   Tamiflu  [oseltamivir] Rash   Iohexol     Desc: VOM[facility]ING-10 YRS AGO [facility]      Medication List        Accurate as of 06/14/17 12:28 PM. Always use your most recent med list.          amantadine 100 MG capsule Commonly known as:  SYMMETREL Take by mouth.   CALCIUM 1200+D3 PO Take by mouth.   CITRACAL/VITAMIN D PO Take by mouth.   cyanocobalamin 1000 MCG/ML injection Commonly known as:  (VITAMIN B-12) Inject 1 mL (1,000 mcg total) into the muscle every 30 (thirty) days.   dimenhyDRINATE 50 MG tablet Commonly known as:  DRAMAMINE Take 50 mg by mouth as needed.   ergocalciferol 50000 units capsule Commonly known as:  VITAMIN D2 Take 1 capsule (50,000 Units total) by mouth 2 (two) times a week.   ibuprofen 200 MG  tablet Commonly known as:  ADVIL,MOTRIN Take 400 mg by mouth every 6 (six) hours as needed for pain.   nitrofurantoin 50 MG capsule Commonly known as:  MACRODANTIN Take 50 mg by mouth daily.   PHENERGAN PO Take 25 mg by mouth as needed. Reported on 11/27/2015       Allergies:  Allergies  Allergen Reactions  . Azithromycin Itching and Rash  . Darvon [Propoxyphene Hcl]     Hives  . Morphine And Related Other (See Comments)  . Tamiflu  [Oseltamivir] Rash  . Iohexol      Desc: VOMITING-10 YRS AGO [facility]     Past Medical History, Surgical history, Social history, and Family History were reviewed and updated.  Review of Systems: All other 10 point review of systems is negative.   Physical Exam:  weight is 105 lb (47.6 kg). Her oral temperature is 98.2 F (36.8 C). Her blood pressure is 118/65 and her pulse is 71. Her respiration is 18 and oxygen saturation is 100%.   Wt Readings from Last 3 Encounters:  06/14/17 105 lb (47.6 kg)  03/08/17 104 lb (47.2 kg)  12/07/16 102 lb (46.3 kg)    Ocular: Sclerae unicteric, pupils equal, round and reactive to light Ear-nose-throat: Oropharynx clear, dentition fair Lymphatic: No cervical, supraclavicular or axillary adenopathy Lungs no rales or rhonchi, good excursion bilaterally  Heart regular rate and rhythm, no murmur appreciated Abd soft, nontender, positive bowel sounds, no liver or spleen tip palpated on exam, no fluid wave  MSK no focal spinal tenderness, no joint edema Neuro: non-focal, well-oriented, appropriate affect Breasts: Deferred   Lab Results  Component Value Date   WBC 9.9 06/14/2017   HGB 12.6 06/14/2017   HCT 40.4 06/14/2017   MCV 97 06/14/2017   PLT 281 06/14/2017   Lab Results  Component Value Date   FERRITIN 753 (H) 03/08/2017   IRON 60 03/08/2017   TIBC 299 03/08/2017   UIBC 239 03/08/2017   IRONPCTSAT 20 (L) 03/08/2017   Lab Results  Component Value Date   RETICCTPCT 1.2 12/25/2011    RBC 4.18 06/14/2017   RETICCTABS 49.8 12/25/2011   No results found for: KPAFRELGTCHN, LAMBDASER, KAPLAMBRATIO No results found for: IGGSERUM, IGA, IGMSERUM No results found for: Marda Stalker, SPEI   Chemistry      Component Value Date/Time   NA 141 03/08/2017 1124   K 4.2 03/08/2017 1124   CL 105 09/07/2016 1151   CO2 27 03/08/2017 1124   BUN 12.4 03/08/2017 1124   CREATININE 0.8 03/08/2017 1124      Component Value Date/Time   CALCIUM 9.8 03/08/2017 1124   ALKPHOS 89 03/08/2017 1124   AST 15 03/08/2017 1124   ALT 14 03/08/2017 1124   BILITOT 0.31 03/08/2017 1124      Impression and Plan: Ms. Goodell is a very pleasant 61 yo caucasian female with 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 She has had some mild fatigue at times with recent flares.  We will see what her iron studies show and bring her back in later this week for infusion if needed.  We will plan to see her back in another 3 months for follow-up and lab.  She will contact our office with any questions or concerns. We can certainly see her sooner if need be.   Verdie Mosher, NP 11/12/201812:28 PM

## 2017-06-15 ENCOUNTER — Other Ambulatory Visit: Payer: Self-pay | Admitting: Family

## 2017-06-15 DIAGNOSIS — E559 Vitamin D deficiency, unspecified: Secondary | ICD-10-CM

## 2017-06-15 LAB — VITAMIN B12: VITAMIN B 12: 233 pg/mL (ref 232–1245)

## 2017-06-15 LAB — VITAMIN D 25 HYDROXY (VIT D DEFICIENCY, FRACTURES): VIT D 25 HYDROXY: 18.1 ng/mL — AB (ref 30.0–100.0)

## 2017-06-15 MED ORDER — ERGOCALCIFEROL 1.25 MG (50000 UT) PO CAPS: 50000.0000 [IU] | ORAL_CAPSULE | ORAL | 8 refills | Status: DC

## 2017-06-18 ENCOUNTER — Ambulatory Visit: Payer: Medicare HMO

## 2017-06-21 ENCOUNTER — Other Ambulatory Visit: Payer: Self-pay

## 2017-06-21 ENCOUNTER — Ambulatory Visit (HOSPITAL_BASED_OUTPATIENT_CLINIC_OR_DEPARTMENT_OTHER): Payer: Medicare HMO

## 2017-06-21 VITALS — BP 103/55 | HR 74 | Temp 98.2°F | Resp 16

## 2017-06-21 DIAGNOSIS — D509 Iron deficiency anemia, unspecified: Secondary | ICD-10-CM

## 2017-06-21 MED ORDER — SODIUM CHLORIDE 0.9 % IV SOLN
510.0000 mg | Freq: Once | INTRAVENOUS | Status: AC
  Administered 2017-06-21: 510 mg via INTRAVENOUS
  Filled 2017-06-21: qty 17

## 2017-06-21 NOTE — Patient Instructions (Signed)

## 2017-09-27 ENCOUNTER — Inpatient Hospital Stay: Payer: Medicare HMO | Attending: Family | Admitting: Family

## 2017-09-27 ENCOUNTER — Encounter: Payer: Self-pay | Admitting: Family

## 2017-09-27 ENCOUNTER — Inpatient Hospital Stay: Payer: Medicare HMO

## 2017-09-27 ENCOUNTER — Other Ambulatory Visit: Payer: Self-pay

## 2017-09-27 VITALS — BP 112/53 | HR 72 | Temp 98.3°F | Resp 20 | Wt 104.8 lb

## 2017-09-27 DIAGNOSIS — D510 Vitamin B12 deficiency anemia due to intrinsic factor deficiency: Secondary | ICD-10-CM

## 2017-09-27 DIAGNOSIS — E559 Vitamin D deficiency, unspecified: Secondary | ICD-10-CM

## 2017-09-27 DIAGNOSIS — K50918 Crohn's disease, unspecified, with other complication: Secondary | ICD-10-CM

## 2017-09-27 DIAGNOSIS — R197 Diarrhea, unspecified: Secondary | ICD-10-CM | POA: Diagnosis not present

## 2017-09-27 DIAGNOSIS — K909 Intestinal malabsorption, unspecified: Secondary | ICD-10-CM

## 2017-09-27 DIAGNOSIS — D509 Iron deficiency anemia, unspecified: Secondary | ICD-10-CM

## 2017-09-27 DIAGNOSIS — K50919 Crohn's disease, unspecified, with unspecified complications: Secondary | ICD-10-CM

## 2017-09-27 LAB — CMP (CANCER CENTER ONLY)
ALBUMIN: 3.6 g/dL (ref 3.5–5.0)
ALK PHOS: 102 U/L — AB (ref 26–84)
ALT: 15 U/L (ref 10–47)
AST: 19 U/L (ref 11–38)
Anion gap: 9 (ref 5–15)
BUN: 9 mg/dL (ref 7–22)
CHLORIDE: 106 mmol/L (ref 98–108)
CO2: 27 mmol/L (ref 18–33)
CREATININE: 1 mg/dL (ref 0.60–1.20)
Calcium: 9.6 mg/dL (ref 8.0–10.3)
GLUCOSE: 95 mg/dL (ref 73–118)
POTASSIUM: 3.5 mmol/L (ref 3.3–4.7)
SODIUM: 142 mmol/L (ref 128–145)
Total Bilirubin: 0.5 mg/dL (ref 0.2–1.6)
Total Protein: 8.3 g/dL — ABNORMAL HIGH (ref 6.4–8.1)

## 2017-09-27 LAB — IRON AND TIBC
Iron: 56 ug/dL (ref 28–170)
SATURATION RATIOS: 18 % (ref 10.4–31.8)
TIBC: 318 ug/dL (ref 250–450)
UIBC: 262 ug/dL

## 2017-09-27 LAB — CBC WITH DIFFERENTIAL (CANCER CENTER ONLY)
Basophils Absolute: 0 10*3/uL (ref 0.0–0.1)
Basophils Relative: 0 %
EOS ABS: 0.3 10*3/uL (ref 0.0–0.5)
EOS PCT: 3 %
HCT: 42.5 % (ref 34.8–46.6)
Hemoglobin: 13.5 g/dL (ref 11.6–15.9)
LYMPHS ABS: 2.1 10*3/uL (ref 0.9–3.3)
Lymphocytes Relative: 21 %
MCH: 30.6 pg (ref 26.0–34.0)
MCHC: 31.8 g/dL — ABNORMAL LOW (ref 32.0–36.0)
MCV: 96.4 fL (ref 81.0–101.0)
MONOS PCT: 5 %
Monocytes Absolute: 0.5 10*3/uL (ref 0.1–0.9)
Neutro Abs: 7.3 10*3/uL — ABNORMAL HIGH (ref 1.5–6.5)
Neutrophils Relative %: 71 %
PLATELETS: 243 10*3/uL (ref 145–400)
RBC: 4.41 MIL/uL (ref 3.70–5.32)
RDW: 13.7 % (ref 11.1–15.7)
WBC: 10.3 10*3/uL — AB (ref 3.9–10.0)

## 2017-09-27 LAB — VITAMIN B12: Vitamin B-12: 192 pg/mL (ref 180–914)

## 2017-09-27 LAB — FERRITIN: FERRITIN: 945 ng/mL — AB (ref 11–307)

## 2017-09-27 NOTE — Progress Notes (Signed)
Hematology and Oncology Follow Up Visit  Denise Lawson [id number] [birth year] 62 y.o. 09/27/2017   Principle Diagnosis:  Recurrent iron deficiency anemia Pernicious anemia Crohn's disease-exacerbation  Current Therapy:  IV iron as indicated - last received in November 2017 B12 1,000 mcg IM monthly - self   Interim History:  Denise Lawson is here today for follow-up. She is doing fairly well. She is having multiple episodes of diarrhea daily. She has not noticed much blood. She prefers the natural approach and does not want to start any auto immune medication. She has history of 4 bowel blockages and is unable to take any thing to stop the diarrhea.  She is managing this by drinking lots of water and bone broth. Her weight is stable. She denies feeling dehydrated.  No fever, chills, n/v, cough, dizziness, SOB, chest pain, palpitations, abdominal pain or changes in bladder habits.  She has small red sores on her legs. She has had similar ones drained as needed in the past. She is monitoring this and will contact her dermatologist  No swelling, tenderness, numbness or tingling in her extremities. No c/o pain at this time.   ECOG Performance Status: 1 - Symptomatic but completely ambulatory  Medications:  Allergies as of 09/27/2017      Reactions   Azithromycin Itching, Rash   Darvon [propoxyphene Hcl]    Hives   Morphine And Related Other (See Comments)   Tamiflu  [oseltamivir] Rash   Iohexol     Desc: [facility]-10 YRS AGO [facility]      Medication List        Accurate as of 09/27/17  3:30 PM. Always use your most recent med list.          amantadine 100 MG capsule Commonly known as:  SYMMETREL Take by mouth.   CALCIUM 1200+D3 PO Take by mouth.   CITRACAL/VITAMIN D PO Take by mouth.   cyanocobalamin 1000 MCG/ML injection Commonly known as:  (VITAMIN B-12) Inject 1 mL (1,000 mcg total) into the muscle every 30 (thirty) days.   dimenhyDRINATE 50 MG  tablet Commonly known as:  DRAMAMINE Take 50 mg by mouth as needed.   ergocalciferol 50000 units capsule Commonly known as:  VITAMIN D2 Take 1 capsule (50,000 Units total) 2 (two) times a week by mouth.   ibuprofen 200 MG tablet Commonly known as:  ADVIL,MOTRIN Take 400 mg by mouth every 6 (six) hours as needed for pain.   nitrofurantoin 50 MG capsule Commonly known as:  MACRODANTIN Take 50 mg by mouth daily.   PHENERGAN PO Take 25 mg by mouth as needed. Reported on 11/27/2015       Allergies:  Allergies  Allergen Reactions  . Azithromycin Itching and Rash  . Darvon [Propoxyphene Hcl]     Hives  . Morphine And Related Other (See Comments)  . Tamiflu  [Oseltamivir] Rash  . Iohexol      Desc: [facility]-10 YRS AGO [facility]     Past Medical History, Surgical history, Social history, and Family History were reviewed and updated.  Review of Systems: All other 10 point review of systems is negative.   Physical Exam:  vitals were not taken for this visit.   Wt Readings from Last 3 Encounters:  06/14/17 105 lb (47.6 kg)  03/08/17 104 lb (47.2 kg)  12/07/16 102 lb (46.3 kg)    Ocular: Sclerae unicteric, pupils equal, round and reactive to light Ear-nose-throat: Oropharynx clear, dentition fair Lymphatic: No cervical, supraclavicular  or axillary adenopathy Lungs no rales or rhonchi, good excursion bilaterally Heart regular rate and rhythm, no murmur appreciated Abd soft, nontender, positive bowel sounds, no liver or spleen tip palpated on exam, no fluid wave  MSK no focal spinal tenderness, no joint edema Neuro: non-focal, well-oriented, appropriate affect Breasts: Deferred   Lab Results  Component Value Date   WBC 10.3 (H) 09/27/2017   HGB 12.6 06/14/2017   HCT 42.5 09/27/2017   MCV 96.4 09/27/2017   PLT 243 09/27/2017   Lab Results  Component Value Date   FERRITIN 874 (H) 06/14/2017   IRON 46 06/14/2017   TIBC 289 06/14/2017   UIBC 243  06/14/2017   IRONPCTSAT 16 (L) 06/14/2017   Lab Results  Component Value Date   RETICCTPCT 1.2 12/25/2011   RBC 4.41 09/27/2017   RETICCTABS 49.8 12/25/2011   No results found for: KPAFRELGTCHN, LAMBDASER, KAPLAMBRATIO No results found for: IGGSERUM, IGA, IGMSERUM No results found for: Dorene Ar, A1GS, A2GS, Colin Benton, MSPIKE, SPEI   Chemistry      Component Value Date/Time   NA 139 06/14/2017 1022   K 3.9 06/14/2017 1022   CL 105 09/07/2016 1151   CO2 26 06/14/2017 1022   BUN 6.8 (L) 06/14/2017 1022   CREATININE 0.8 06/14/2017 1022      Component Value Date/Time   CALCIUM 9.6 06/14/2017 1022   ALKPHOS 91 06/14/2017 1022   AST 15 06/14/2017 1022   ALT 14 06/14/2017 1022   BILITOT 0.40 06/14/2017 1022      Impression and Plan: Denise Lawson is a very pleasant 62 yo caucasian female with 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 She has had flares with her Crohn's lately with frequent diarrhea.  We will see what her iron studies show and bring her back in for infusion if needed. We will plan to see her back in another 4 months for follow-up.   She will contact our office with any questions or concerns. We can certainly see her sooner if need be.   Emeline Gins, NP 2/25/20193:30 PM

## 2017-09-28 LAB — VITAMIN D 25 HYDROXY (VIT D DEFICIENCY, FRACTURES): Vit D, 25-Hydroxy: 14.7 ng/mL — ABNORMAL LOW (ref 30.0–100.0)

## 2017-09-30 ENCOUNTER — Telehealth: Payer: Self-pay | Admitting: *Deleted

## 2017-09-30 NOTE — Telephone Encounter (Addendum)
Patient is aware of results. She will start taking medication by puncturing the capsule and placing liquid under the tongue.  ----- Message from Eliezer Bottom, NP sent at 09/30/2017  1:27 PM EST ----- Regarding: Vit D Iron studies look good. Vitamin D still quite low. Is she taking her supplement twice a week as prescribed? She may need to puncture capsule and put under tongue. Thank you!  Sarah  ----- Message ----- From: Buel Ream, Lab In [place] Sent: 09/27/2017   3:28 PM To: Eliezer Bottom, NP

## 2018-01-03 ENCOUNTER — Inpatient Hospital Stay (HOSPITAL_BASED_OUTPATIENT_CLINIC_OR_DEPARTMENT_OTHER): Payer: Medicare HMO | Admitting: Family

## 2018-01-03 ENCOUNTER — Encounter: Payer: Self-pay | Admitting: Family

## 2018-01-03 ENCOUNTER — Other Ambulatory Visit: Payer: Self-pay

## 2018-01-03 ENCOUNTER — Inpatient Hospital Stay: Payer: Medicare HMO | Attending: Hematology & Oncology

## 2018-01-03 VITALS — BP 106/52 | HR 80 | Temp 98.4°F | Resp 18 | Wt 97.5 lb

## 2018-01-03 DIAGNOSIS — D500 Iron deficiency anemia secondary to blood loss (chronic): Secondary | ICD-10-CM

## 2018-01-03 DIAGNOSIS — K50919 Crohn's disease, unspecified, with unspecified complications: Secondary | ICD-10-CM

## 2018-01-03 DIAGNOSIS — E559 Vitamin D deficiency, unspecified: Secondary | ICD-10-CM

## 2018-01-03 DIAGNOSIS — K909 Intestinal malabsorption, unspecified: Secondary | ICD-10-CM

## 2018-01-03 DIAGNOSIS — D509 Iron deficiency anemia, unspecified: Secondary | ICD-10-CM

## 2018-01-03 DIAGNOSIS — D510 Vitamin B12 deficiency anemia due to intrinsic factor deficiency: Secondary | ICD-10-CM | POA: Diagnosis not present

## 2018-01-03 DIAGNOSIS — Z79899 Other long term (current) drug therapy: Secondary | ICD-10-CM | POA: Diagnosis not present

## 2018-01-03 DIAGNOSIS — R5383 Other fatigue: Secondary | ICD-10-CM | POA: Insufficient documentation

## 2018-01-03 LAB — CMP (CANCER CENTER ONLY)
ALBUMIN: 3.2 g/dL — AB (ref 3.5–5.0)
ALT: 15 U/L (ref 10–47)
ANION GAP: 9 (ref 5–15)
AST: 19 U/L (ref 11–38)
Alkaline Phosphatase: 94 U/L — ABNORMAL HIGH (ref 26–84)
BILIRUBIN TOTAL: 0.6 mg/dL (ref 0.2–1.6)
BUN: 10 mg/dL (ref 7–22)
CHLORIDE: 106 mmol/L (ref 98–108)
CO2: 30 mmol/L (ref 18–33)
CREATININE: 0.8 mg/dL (ref 0.60–1.20)
Calcium: 9.6 mg/dL (ref 8.0–10.3)
Glucose, Bld: 94 mg/dL (ref 73–118)
Potassium: 4.1 mmol/L (ref 3.3–4.7)
Sodium: 145 mmol/L (ref 128–145)
Total Protein: 7.5 g/dL (ref 6.4–8.1)

## 2018-01-03 LAB — CBC WITH DIFFERENTIAL (CANCER CENTER ONLY)
BASOS PCT: 0 %
Basophils Absolute: 0 10*3/uL (ref 0.0–0.1)
EOS PCT: 2 %
Eosinophils Absolute: 0.2 10*3/uL (ref 0.0–0.5)
HCT: 40.3 % (ref 34.8–46.6)
Hemoglobin: 12.7 g/dL (ref 11.6–15.9)
Lymphocytes Relative: 19 %
Lymphs Abs: 2.2 10*3/uL (ref 0.9–3.3)
MCH: 30.2 pg (ref 26.0–34.0)
MCHC: 31.5 g/dL — AB (ref 32.0–36.0)
MCV: 96 fL (ref 81.0–101.0)
MONO ABS: 0.7 10*3/uL (ref 0.1–0.9)
MONOS PCT: 6 %
Neutro Abs: 8.7 10*3/uL — ABNORMAL HIGH (ref 1.5–6.5)
Neutrophils Relative %: 73 %
PLATELETS: 298 10*3/uL (ref 145–400)
RBC: 4.2 MIL/uL (ref 3.70–5.32)
RDW: 13.4 % (ref 11.1–15.7)
WBC Count: 11.9 10*3/uL — ABNORMAL HIGH (ref 3.9–10.0)

## 2018-01-03 NOTE — Progress Notes (Signed)
Hematology and Oncology Follow Up Visit  Denise Lawson [id number] [birth year] 62 y.o. 01/03/2018   Principle Diagnosis:  Recurrent iron deficiency anemia Pernicious anemia Crohn's disease - exacerbation  Current Therapy:   IV iron as indicated - last received in November 2018 B12 1,000 mcg IM monthly - self   Interim History:  Denise Lawson is here today for follow-up. She is symptomatic with fatigue and lightheadedness. She has also noticed occasional palpitations and craving salty and sugary foods.  She has not been able to eat much lately due to abdominal discomfort. She is avoiding meat because she has problems digesting.  She is doing her best to stay well hydrated. Her weight is down 7 lbs since her last visit in February.  She is considering going back to see rheumatology to discuss treatment options for Crohn's. She has had trouble tolerating treatment in the past.  She states that she has frequent BM's. Hourly at times. This causes back spasms off and on.  No episodes of bleeding, no bruising or petechiae.  No swelling, tenderness, numbness or tingling in her extremities.   ECOG Performance Status: 1 - Symptomatic but completely ambulatory  Medications:  Allergies as of 01/03/2018      Reactions   Azithromycin Itching, Rash   Darvon [propoxyphene Hcl]    Hives   Morphine And Related Other (See Comments)   Tamiflu  [oseltamivir] Rash   Iohexol     Desc: VOMITING-10 YRS AGO [facility]      Medication List        Accurate as of 01/03/18  1:51 PM. Always use your most recent med list.          amantadine 100 MG capsule Commonly known as:  SYMMETREL Take by mouth.   CALCIUM 1200+D3 PO Take by mouth.   CITRACAL/VITAMIN D PO Take by mouth.   cyanocobalamin 1000 MCG/ML injection Commonly known as:  (VITAMIN B-12) Inject 1 mL (1,000 mcg total) into the muscle every 30 (thirty) days.   dimenhyDRINATE 50 MG tablet Commonly known as:  DRAMAMINE Take 50 mg  by mouth as needed.   ergocalciferol 50000 units capsule Commonly known as:  VITAMIN D2 Take 1 capsule (50,000 Units total) 2 (two) times a week by mouth.   ibuprofen 200 MG tablet Commonly known as:  ADVIL,MOTRIN Take 400 mg by mouth every 6 (six) hours as needed for pain.   nitrofurantoin 50 MG capsule Commonly known as:  MACRODANTIN Take 50 mg by mouth daily.   PHENERGAN PO Take 25 mg by mouth as needed. Reported on 11/27/2015       Allergies:  Allergies  Allergen Reactions  . Azithromycin Itching and Rash  . Darvon [Propoxyphene Hcl]     Hives  . Morphine And Related Other (See Comments)  . Tamiflu  [Oseltamivir] Rash  . Iohexol      Desc: VOMITING-10 YRS AGO @ [facility].     Past Medical History, Surgical history, Social history, and Family History were reviewed and updated.  Review of Systems: All other 10 point review of systems is negative.   Physical Exam:  vitals were not taken for this visit.   Wt Readings from Last 3 Encounters:  09/27/17 104 lb 12.8 oz (47.5 kg)  06/14/17 105 lb (47.6 kg)  03/08/17 104 lb (47.2 kg)    Ocular: Sclerae unicteric, pupils equal, round and reactive to light Ear-nose-throat: Oropharynx clear, dentition fair Lymphatic: No cervical, supraclavicular or axillary adenopathy Lungs no rales  or rhonchi, good excursion bilaterally Heart regular rate and rhythm, no murmur appreciated Abd soft, nontender, positive bowel sounds, no liver or spleen tip palpated on exam, no fluid wave  MSK no focal spinal tenderness, no joint edema Neuro: non-focal, well-oriented, appropriate affect Breasts: Deferred   Lab Results  Component Value Date   WBC 11.9 (H) 01/03/2018   HGB 12.7 01/03/2018   HCT 40.3 01/03/2018   MCV 96.0 01/03/2018   PLT 298 01/03/2018   Lab Results  Component Value Date   FERRITIN 945 (H) 09/27/2017   IRON 56 09/27/2017   TIBC 318 09/27/2017   UIBC 262 09/27/2017   IRONPCTSAT 18 09/27/2017   Lab  Results  Component Value Date   RETICCTPCT 1.2 12/25/2011   RBC 4.20 01/03/2018   RETICCTABS 49.8 12/25/2011   No results found for: KPAFRELGTCHN, LAMBDASER, KAPLAMBRATIO No results found for: IGGSERUM, IGA, IGMSERUM No results found for: Marda Stalker, SPEI   Chemistry      Component Value Date/Time   NA 142 09/27/2017 1520   NA 139 06/14/2017 1022   K 3.5 09/27/2017 1520   K 3.9 06/14/2017 1022   CL 106 09/27/2017 1520   CL 105 09/07/2016 1151   CO2 27 09/27/2017 1520   CO2 26 06/14/2017 1022   BUN 9 09/27/2017 1520   BUN 6.8 (L) 06/14/2017 1022   CREATININE 1.00 09/27/2017 1520   CREATININE 0.8 06/14/2017 1022      Component Value Date/Time   CALCIUM 9.6 09/27/2017 1520   CALCIUM 9.6 06/14/2017 1022   ALKPHOS 102 (H) 09/27/2017 1520   ALKPHOS 91 06/14/2017 1022   AST 19 09/27/2017 1520   AST 15 06/14/2017 1022   ALT 15 09/27/2017 1520   ALT 14 06/14/2017 1022   BILITOT 0.5 09/27/2017 1520   BILITOT 0.40 06/14/2017 1022      Impression and Plan: Denise Lawson is a very pleasant 62 yo caucasian female with 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 She is still having issue with flares and is considering following up with rheumatology. She really does not want to be on medication.  She is symptomatic with fatigue and occasional palpitations.  We will see what her iron studies show and bring her back in for infusion if needed.  We will plan to see her back in another 4 months for follow-up.  She will contact our office with any questions or concerns. We can certainly see her sooner if need be.   Emeline Gins, NP 6/3/20191:51 PM

## 2018-01-04 LAB — IRON AND TIBC
IRON: 43 ug/dL (ref 41–142)
Saturation Ratios: 16 % — ABNORMAL LOW (ref 21–57)
TIBC: 273 ug/dL (ref 236–444)
UIBC: 230 ug/dL

## 2018-01-04 LAB — VITAMIN D 25 HYDROXY (VIT D DEFICIENCY, FRACTURES): Vit D, 25-Hydroxy: 19.6 ng/mL — ABNORMAL LOW (ref 30.0–100.0)

## 2018-01-04 LAB — FERRITIN: Ferritin: 1253 ng/mL — ABNORMAL HIGH (ref 9–269)

## 2018-01-05 ENCOUNTER — Telehealth: Payer: Self-pay | Admitting: *Deleted

## 2018-01-05 NOTE — Telephone Encounter (Addendum)
Patient is aware of results. She is puncturing her capsules. She would like a vitamin B12 level checked on her next appointment. Sarah notified.   ----- Message from Eliezer Bottom, NP sent at 01/04/2018 10:57 AM EDT ----- LOS sent to [facility] for IV iron. Is she puncturing her Vitamin D capsule and putting under her tongue twice a week?   ----- Message ----- From: Interface, [facility] Sent: 01/03/2018   1:45 PM To: Eliezer Bottom, NP

## 2018-01-06 ENCOUNTER — Other Ambulatory Visit: Payer: Medicare HMO

## 2018-01-06 ENCOUNTER — Ambulatory Visit: Payer: Medicare HMO | Admitting: Family

## 2018-01-11 ENCOUNTER — Inpatient Hospital Stay: Payer: Medicare HMO

## 2018-01-11 ENCOUNTER — Ambulatory Visit: Payer: Medicare HMO

## 2018-01-11 ENCOUNTER — Other Ambulatory Visit: Payer: Self-pay | Admitting: Family

## 2018-01-11 VITALS — BP 110/56 | HR 91 | Temp 98.1°F | Resp 20

## 2018-01-11 DIAGNOSIS — D500 Iron deficiency anemia secondary to blood loss (chronic): Secondary | ICD-10-CM | POA: Diagnosis not present

## 2018-01-11 DIAGNOSIS — D509 Iron deficiency anemia, unspecified: Secondary | ICD-10-CM

## 2018-01-11 DIAGNOSIS — D510 Vitamin B12 deficiency anemia due to intrinsic factor deficiency: Secondary | ICD-10-CM

## 2018-01-11 MED ORDER — SODIUM CHLORIDE 0.9 % IV SOLN
510.0000 mg | Freq: Once | INTRAVENOUS | Status: AC
  Administered 2018-01-11: 510 mg via INTRAVENOUS
  Filled 2018-01-11: qty 17

## 2018-01-11 MED ORDER — SODIUM CHLORIDE 0.9 % IV SOLN
INTRAVENOUS | Status: DC
  Administered 2018-01-11: 15:00:00 via INTRAVENOUS

## 2018-01-11 NOTE — Patient Instructions (Signed)

## 2018-01-24 ENCOUNTER — Ambulatory Visit: Payer: Medicare HMO | Admitting: Family

## 2018-01-24 ENCOUNTER — Other Ambulatory Visit: Payer: Medicare HMO

## 2018-05-09 ENCOUNTER — Inpatient Hospital Stay: Payer: Medicare HMO | Attending: Family | Admitting: Family

## 2018-05-09 ENCOUNTER — Other Ambulatory Visit: Payer: Self-pay | Admitting: Family

## 2018-05-09 ENCOUNTER — Inpatient Hospital Stay: Payer: Medicare HMO

## 2018-05-09 VITALS — BP 106/54 | HR 72 | Temp 98.5°F | Resp 17 | Wt 95.5 lb

## 2018-05-09 DIAGNOSIS — K50918 Crohn's disease, unspecified, with other complication: Secondary | ICD-10-CM

## 2018-05-09 DIAGNOSIS — D510 Vitamin B12 deficiency anemia due to intrinsic factor deficiency: Secondary | ICD-10-CM

## 2018-05-09 DIAGNOSIS — D509 Iron deficiency anemia, unspecified: Secondary | ICD-10-CM

## 2018-05-09 DIAGNOSIS — E559 Vitamin D deficiency, unspecified: Secondary | ICD-10-CM

## 2018-05-09 DIAGNOSIS — K9089 Other intestinal malabsorption: Secondary | ICD-10-CM

## 2018-05-09 DIAGNOSIS — K50919 Crohn's disease, unspecified, with unspecified complications: Secondary | ICD-10-CM

## 2018-05-09 DIAGNOSIS — K909 Intestinal malabsorption, unspecified: Secondary | ICD-10-CM

## 2018-05-09 DIAGNOSIS — D508 Other iron deficiency anemias: Secondary | ICD-10-CM

## 2018-05-09 LAB — CBC WITH DIFFERENTIAL (CANCER CENTER ONLY)
BASOS ABS: 0 10*3/uL (ref 0.0–0.1)
BASOS PCT: 0 %
Eosinophils Absolute: 0.3 10*3/uL (ref 0.0–0.5)
Eosinophils Relative: 2 %
HCT: 38.1 % (ref 34.8–46.6)
HEMOGLOBIN: 11.9 g/dL (ref 11.6–15.9)
LYMPHS PCT: 14 %
Lymphs Abs: 2.2 10*3/uL (ref 0.9–3.3)
MCH: 30.4 pg (ref 26.0–34.0)
MCHC: 31.2 g/dL — ABNORMAL LOW (ref 32.0–36.0)
MCV: 97.4 fL (ref 81.0–101.0)
MONO ABS: 1 10*3/uL — AB (ref 0.1–0.9)
Monocytes Relative: 6 %
NEUTROS PCT: 78 %
Neutro Abs: 11.8 10*3/uL — ABNORMAL HIGH (ref 1.5–6.5)
Platelet Count: 317 10*3/uL (ref 145–400)
RBC: 3.91 MIL/uL (ref 3.70–5.32)
RDW: 13.3 % (ref 11.1–15.7)
WBC Count: 15.3 10*3/uL — ABNORMAL HIGH (ref 3.9–10.0)

## 2018-05-09 LAB — CMP (CANCER CENTER ONLY)
ALT: 16 U/L (ref 0–44)
AST: 16 U/L (ref 15–41)
Albumin: 3.2 g/dL — ABNORMAL LOW (ref 3.5–5.0)
Alkaline Phosphatase: 144 U/L — ABNORMAL HIGH (ref 38–126)
Anion gap: 12 (ref 5–15)
BUN: 10 mg/dL (ref 6–20)
CHLORIDE: 101 mmol/L (ref 98–111)
CO2: 25 mmol/L (ref 22–32)
Calcium: 9.8 mg/dL (ref 8.9–10.3)
Creatinine: 0.76 mg/dL (ref 0.44–1.00)
GFR, Estimated: >60 mL/min (ref >60)
Glucose, Bld: 91 mg/dL (ref 70–99)
Potassium: 4.1 mmol/L (ref 3.5–5.1)
SODIUM: 138 mmol/L (ref 135–145)
Total Bilirubin: 0.3 mg/dL (ref 0.3–1.2)
Total Protein: 8 g/dL (ref 6.5–8.1)

## 2018-05-09 LAB — VITAMIN B12: Vitamin B-12: 363 pg/mL (ref 180–914)

## 2018-05-09 NOTE — Progress Notes (Signed)
Hematology and Oncology Follow Up Visit  Denise Lawson [id number] 16-Nov-1955 61 y.o. 05/09/2018   Principle Diagnosis:  Recurrent iron deficiency anemia Pernicious anemia Crohn's disease - exacerbation  Current Therapy:   IV iron as indicated - last received in June 2019 B12 1,000 mcg IM monthly - self   Interim History: Denise Lawson is here today with her daughter for follow-up. She is getting over a recent Crohn's flare and is still symptomatic with fatigue, weakness and chills.  Hgb is stable at 15.3 with an MCV of 97.  She has not noted any blood in her stool. No bruising or petechiae.  She is out of B 12 for her injections and needs a refill.  No fever, n/v, cough rash, dizziness, SOB, chest pain, palpitations or changes in bladder habits.  No swelling, tenderness, numbness or tingling in her extremities.  No lymphadenopathy noted on exam.  Her appetite is improving, abdominal cramps are a bit improved. She is drinking carnation instant breakfast and trying her best to stay well hydrated. Her weight is down 2 lbs.   ECOG Performance Status: 1 - Symptomatic but completely ambulatory  Medications:  Allergies as of 05/09/2018      Reactions   Azithromycin Itching, Rash   Darvon [propoxyphene Hcl]    Hives   Propoxyphene Itching   Hives   Iohexol Nausea And Vomiting    Desc: VOMITING-10 YRS AGO @ [facility].  Desc: VOMITING-10 YRS AGO @ [facility].   Morphine And Related Other (See Comments)   Oseltamivir Rash      Medication List        Accurate as of 05/09/18  1:33 PM. Always use your most recent med list.          amantadine 100 MG capsule Commonly known as:  SYMMETREL Take by mouth.   CALCIUM 1200+D3 PO Take by mouth.   CITRACAL/VITAMIN D PO Take by mouth.   cyanocobalamin 1000 MCG/ML injection Commonly known as:  (VITAMIN B-12) Inject 1 mL (1,000 mcg total) into the muscle every 30 (thirty) days.   dimenhyDRINATE 50 MG tablet Commonly  known as:  DRAMAMINE Take 50 mg by mouth as needed.   ergocalciferol 50000 units capsule Commonly known as:  VITAMIN D2 Take 1 capsule (50,000 Units total) 2 (two) times a week by mouth.   ibuprofen 200 MG tablet Commonly known as:  ADVIL,MOTRIN Take 400 mg by mouth every 6 (six) hours as needed for pain.   nitrofurantoin 50 MG capsule Commonly known as:  MACRODANTIN Take 50 mg by mouth daily.   PHENERGAN PO Take 25 mg by mouth as needed. Reported on 11/27/2015       Allergies:  Allergies  Allergen Reactions  . Azithromycin Itching and Rash  . Darvon [Propoxyphene Hcl]     Hives  . Propoxyphene Itching    Hives  . Iohexol Nausea And Vomiting     Desc: VOMITING-10 YRS AGO @ [facility].   Desc: VOMITING-10 YRS AGO @ [facility].  Marland Kitchen Morphine And Related Other (See Comments)  . Oseltamivir Rash    Past Medical History, Surgical history, Social history, and Family History were reviewed and updated.  Review of Systems: All other 10 point review of systems is negative.   Physical Exam:  weight is 95 lb 8 oz (43.3 kg). Her oral temperature is 98.5 F (36.9 C). Her blood pressure is 106/54 (abnormal) and her pulse is 72. Her respiration is 17.   Wt Readings from  Last 3 Encounters:  05/09/18 95 lb 8 oz (43.3 kg)  01/03/18 97 lb 8 oz (44.2 kg)  09/27/17 104 lb 12.8 oz (47.5 kg)    Ocular: Sclerae unicteric, pupils equal, round and reactive to light Ear-nose-throat: Oropharynx clear, dentition fair Lymphatic: No cervical, supraclavicular or axillary adenopathy Lungs no rales or rhonchi, good excursion bilaterally Heart regular rate and rhythm, no murmur appreciated Abd soft, nontender, positive bowel sounds, no liver or spleen tip palpated on exam, no fluid wave  MSK no focal spinal tenderness, no joint edema Neuro: non-focal, well-oriented, appropriate affect Breasts: Deferred   Lab Results  Component Value Date   WBC 15.3 (H) 05/09/2018   HGB 11.9  05/09/2018   HCT 38.1 05/09/2018   MCV 97.4 05/09/2018   PLT 317 05/09/2018   Lab Results  Component Value Date   FERRITIN 1,253 (H) 01/03/2018   IRON 43 01/03/2018   TIBC 273 01/03/2018   UIBC 230 01/03/2018   IRONPCTSAT 16 (L) 01/03/2018   Lab Results  Component Value Date   RETICCTPCT 1.2 12/25/2011   RBC 3.91 05/09/2018   RETICCTABS 49.8 12/25/2011   No results found for: KPAFRELGTCHN, LAMBDASER, KAPLAMBRATIO No results found for: IGGSERUM, IGA, IGMSERUM No results found for: Odetta Pink, SPEI   Chemistry      Component Value Date/Time   NA 145 01/03/2018 1326   NA 139 06/14/2017 1022   K 4.1 01/03/2018 1326   K 3.9 06/14/2017 1022   CL 106 01/03/2018 1326   CL 105 09/07/2016 1151   CO2 30 01/03/2018 1326   CO2 26 06/14/2017 1022   BUN 10 01/03/2018 1326   BUN 6.8 (L) 06/14/2017 1022   CREATININE 0.80 01/03/2018 1326   CREATININE 0.8 06/14/2017 1022      Component Value Date/Time   CALCIUM 9.6 01/03/2018 1326   CALCIUM 9.6 06/14/2017 1022   ALKPHOS 94 (H) 01/03/2018 1326   ALKPHOS 91 06/14/2017 1022   AST 19 01/03/2018 1326   AST 15 06/14/2017 1022   ALT 15 01/03/2018 1326   ALT 14 06/14/2017 1022   BILITOT 0.6 01/03/2018 1326   BILITOT 0.40 06/14/2017 1022       Impression and Plan: Denise Lawson is a very pleasant 62 yo caucasian female with 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 She is recuperating from a recent Crohn's flare and is symptomatic as mentioned above.  We will see what her iron studies and B12 level show and replace as needed.  We will go ahead and plan to see her again in another 4 months for follow-up.  She will contact our office with any questions or concerns. We can certainly see her sooner if need be.   Denise Peace, NP 10/7/20191:33 PM

## 2018-05-10 LAB — IRON AND TIBC
IRON: 36 ug/dL — AB (ref 41–142)
Saturation Ratios: 14 % — ABNORMAL LOW (ref 21–57)
TIBC: 259 ug/dL (ref 236–444)
UIBC: 223 ug/dL

## 2018-05-10 LAB — VITAMIN D 25 HYDROXY (VIT D DEFICIENCY, FRACTURES): Vit D, 25-Hydroxy: 26.3 ng/mL — ABNORMAL LOW (ref 30.0–100.0)

## 2018-05-10 LAB — FERRITIN: Ferritin: 2239 ng/mL — ABNORMAL HIGH (ref 11–307)

## 2018-05-16 ENCOUNTER — Other Ambulatory Visit: Payer: Self-pay | Admitting: Oncology

## 2018-05-16 ENCOUNTER — Other Ambulatory Visit: Payer: Self-pay

## 2018-05-16 ENCOUNTER — Inpatient Hospital Stay: Payer: Medicare HMO

## 2018-05-16 VITALS — BP 103/50 | HR 82 | Temp 98.2°F | Resp 18

## 2018-05-16 DIAGNOSIS — E559 Vitamin D deficiency, unspecified: Secondary | ICD-10-CM

## 2018-05-16 DIAGNOSIS — K909 Intestinal malabsorption, unspecified: Secondary | ICD-10-CM

## 2018-05-16 DIAGNOSIS — K50113 Crohn's disease of large intestine with fistula: Secondary | ICD-10-CM

## 2018-05-16 DIAGNOSIS — D508 Other iron deficiency anemias: Secondary | ICD-10-CM | POA: Diagnosis not present

## 2018-05-16 DIAGNOSIS — D510 Vitamin B12 deficiency anemia due to intrinsic factor deficiency: Secondary | ICD-10-CM

## 2018-05-16 DIAGNOSIS — D509 Iron deficiency anemia, unspecified: Secondary | ICD-10-CM

## 2018-05-16 MED ORDER — CYANOCOBALAMIN 1000 MCG/ML IJ SOLN: 1000.0000 ug | INTRAMUSCULAR | 5 refills | Status: DC

## 2018-05-16 MED ORDER — SODIUM CHLORIDE 0.9 % IV SOLN
510.0000 mg | Freq: Once | INTRAVENOUS | Status: AC
  Administered 2018-05-16: 510 mg via INTRAVENOUS
  Filled 2018-05-16: qty 17

## 2018-05-16 MED ORDER — SODIUM CHLORIDE 0.9 % IV SOLN
Freq: Once | INTRAVENOUS | Status: AC
  Administered 2018-05-16: 13:00:00 via INTRAVENOUS
  Filled 2018-05-16: qty 250

## 2018-05-16 NOTE — Patient Instructions (Signed)

## 2018-09-05 ENCOUNTER — Encounter: Payer: Self-pay | Admitting: Family

## 2018-09-05 ENCOUNTER — Inpatient Hospital Stay: Payer: Medicare HMO | Attending: Family | Admitting: Family

## 2018-09-05 ENCOUNTER — Other Ambulatory Visit: Payer: Self-pay

## 2018-09-05 ENCOUNTER — Inpatient Hospital Stay: Payer: Medicare HMO

## 2018-09-05 VITALS — BP 107/51 | HR 77 | Temp 97.9°F | Resp 18 | Ht 61.0 in | Wt 108.8 lb

## 2018-09-05 DIAGNOSIS — R5383 Other fatigue: Secondary | ICD-10-CM | POA: Insufficient documentation

## 2018-09-05 DIAGNOSIS — D509 Iron deficiency anemia, unspecified: Secondary | ICD-10-CM

## 2018-09-05 DIAGNOSIS — E559 Vitamin D deficiency, unspecified: Secondary | ICD-10-CM

## 2018-09-05 DIAGNOSIS — K909 Intestinal malabsorption, unspecified: Secondary | ICD-10-CM | POA: Diagnosis not present

## 2018-09-05 DIAGNOSIS — K5090 Crohn's disease, unspecified, without complications: Secondary | ICD-10-CM | POA: Insufficient documentation

## 2018-09-05 DIAGNOSIS — D510 Vitamin B12 deficiency anemia due to intrinsic factor deficiency: Secondary | ICD-10-CM

## 2018-09-05 DIAGNOSIS — R6883 Chills (without fever): Secondary | ICD-10-CM | POA: Diagnosis not present

## 2018-09-05 DIAGNOSIS — Z79899 Other long term (current) drug therapy: Secondary | ICD-10-CM | POA: Diagnosis not present

## 2018-09-05 DIAGNOSIS — K50113 Crohn's disease of large intestine with fistula: Secondary | ICD-10-CM

## 2018-09-05 LAB — VITAMIN B12: Vitamin B-12: 133 pg/mL — ABNORMAL LOW (ref 180–914)

## 2018-09-05 LAB — CBC WITH DIFFERENTIAL (CANCER CENTER ONLY)
ABS IMMATURE GRANULOCYTES: 0.02 10*3/uL (ref 0.00–0.07)
BASOS PCT: 0 %
Basophils Absolute: 0 10*3/uL (ref 0.0–0.1)
Eosinophils Absolute: 0.2 10*3/uL (ref 0.0–0.5)
Eosinophils Relative: 2 %
HCT: 42.8 % (ref 36.0–46.0)
Hemoglobin: 13.5 g/dL (ref 12.0–15.0)
Immature Granulocytes: 0 %
Lymphocytes Relative: 18 %
Lymphs Abs: 1.8 10*3/uL (ref 0.7–4.0)
MCH: 30.7 pg (ref 26.0–34.0)
MCHC: 31.5 g/dL (ref 30.0–36.0)
MCV: 97.3 fL (ref 80.0–100.0)
Monocytes Absolute: 0.5 10*3/uL (ref 0.1–1.0)
Monocytes Relative: 5 %
NRBC: 0 % (ref 0.0–0.2)
Neutro Abs: 7.3 10*3/uL (ref 1.7–7.7)
Neutrophils Relative %: 75 %
Platelet Count: 219 10*3/uL (ref 150–400)
RBC: 4.4 MIL/uL (ref 3.87–5.11)
RDW: 13.4 % (ref 11.5–15.5)
WBC Count: 9.9 10*3/uL (ref 4.0–10.5)

## 2018-09-05 LAB — CMP (CANCER CENTER ONLY)
ALBUMIN: 4.1 g/dL (ref 3.5–5.0)
ALT: 11 U/L (ref 0–44)
AST: 12 U/L — ABNORMAL LOW (ref 15–41)
Alkaline Phosphatase: 104 U/L (ref 38–126)
Anion gap: 9 (ref 5–15)
BUN: 8 mg/dL (ref 8–23)
CHLORIDE: 103 mmol/L (ref 98–111)
CO2: 28 mmol/L (ref 22–32)
Calcium: 9.7 mg/dL (ref 8.9–10.3)
Creatinine: 0.77 mg/dL (ref 0.44–1.00)
GFR, Est AFR Am: >60 mL/min (ref >60)
GFR, Estimated: >60 mL/min (ref >60)
GLUCOSE: 113 mg/dL — AB (ref 70–99)
Potassium: 3.7 mmol/L (ref 3.5–5.1)
Sodium: 140 mmol/L (ref 135–145)
Total Bilirubin: 0.3 mg/dL (ref 0.3–1.2)
Total Protein: 7.1 g/dL (ref 6.5–8.1)

## 2018-09-05 NOTE — Progress Notes (Signed)
Hematology and Oncology Follow Up Visit  Denise Lawson [id number] 24-Sep-1955 63 y.o. 09/05/2018   Principle Diagnosis:  Recurrent iron deficiency anemia Pernicious anemia Crohn's disease - exacerbation  Current Therapy:   IV iron as indicated - last received in June 2019 B12 1,000 mcg IM monthly - self   Interim History:  Denise Lawson is here today for follow-up. She is doing well but noted a slight decrease in her energy recently as well as chills.  Her bowel habits remain the same. She has not noted any bleeding.  She states that she is eating 1 good meal a day. Her weight is up 13 lbs since we saw her in October 2019. She is hydrating well.  No fever, n/v, cough, rash, dizziness, SOB, chest pain, palpitations, abdominal pain or changes in bladder habits.  No swelling, tenderness, numbness or tingling in her extremities at this time.  No lymphadenopathy noted on exam.  She plans to follow up with dermatology for several 1 cm x 1 cm spots on her right chest and left face. No open lesions.   ECOG Performance Status: 1 - Symptomatic but completely ambulatory  Medications:  Allergies as of 09/05/2018      Reactions   Azithromycin Itching, Rash   Darvon [propoxyphene Hcl]    Hives   Iohexol Nausea And Vomiting    Desc: VOMITING-10 YRS AGO [facility]   Propoxyphene Hives, Itching   Morphine And Related Other (See Comments)   Oseltamivir Rash      Medication List       Accurate as of September 05, 2018  1:29 PM. Always use your most recent med list.        amantadine 100 MG capsule Commonly known as:  SYMMETREL Take by mouth.   CALCIUM 1200+D3 PO Take by mouth.   CITRACAL/VITAMIN D PO Take by mouth.   cyanocobalamin 1000 MCG/ML injection Commonly known as:  (VITAMIN B-12) Inject 1 mL (1,000 mcg total) into the muscle every 30 (thirty) days.   dimenhyDRINATE 50 MG tablet Commonly known as:  DRAMAMINE Take 50 mg by mouth as needed.   ergocalciferol 1.25 MG  (50000 UT) capsule Commonly known as:  VITAMIN D2 Take 1 capsule (50,000 Units total) 2 (two) times a week by mouth.   ibuprofen 200 MG tablet Commonly known as:  ADVIL,MOTRIN Take 400 mg by mouth every 6 (six) hours as needed for pain.   nitrofurantoin 50 MG capsule Commonly known as:  MACRODANTIN Take 50 mg by mouth daily.   PHENERGAN PO Take 25 mg by mouth as needed. Reported on 11/27/2015       Allergies:  Allergies  Allergen Reactions  . Azithromycin Itching and Rash  . Darvon [Propoxyphene Hcl]     Hives  . Iohexol Nausea And Vomiting     Desc: VOMITING-10 YRS AGO [facility]  Marland Kitchen Propoxyphene Hives and Itching  . Morphine And Related Other (See Comments)  . Oseltamivir Rash    Past Medical History, Surgical history, Social history, and Family History were reviewed and updated.  Review of Systems: All other 10 point review of systems is negative.   Physical Exam:  height is 5' 1"  (1.549 m) and weight is 108 lb 12.8 oz (49.4 kg). Her oral temperature is 97.9 F (36.6 C). Her blood pressure is 107/51 (abnormal) and her pulse is 77. Her respiration is 18 and oxygen saturation is 100%.   Wt Readings from Last 3 Encounters:  09/05/18 108 lb  12.8 oz (49.4 kg)  05/09/18 95 lb 8 oz (43.3 kg)  01/03/18 97 lb 8 oz (44.2 kg)    Ocular: Sclerae unicteric, pupils equal, round and reactive to light Ear-nose-throat: Oropharynx clear, dentition fair Lymphatic: No cervical, supraclavicular or axillary adenopathy Lungs no rales or rhonchi, good excursion bilaterally Heart regular rate and rhythm, no murmur appreciated Abd soft, nontender, positive bowel sounds, no liver or spleen tip palpated on exam, no fluid wave MSK no focal spinal tenderness, no joint edema Neuro: non-focal, well-oriented, appropriate affect Breasts: Deferred   Lab Results  Component Value Date   WBC 9.9 09/05/2018   HGB 13.5 09/05/2018   HCT 42.8 09/05/2018   MCV 97.3 09/05/2018   PLT 219  09/05/2018   Lab Results  Component Value Date   FERRITIN 2,239 (H) 05/09/2018   IRON 36 (L) 05/09/2018   TIBC 259 05/09/2018   UIBC 223 05/09/2018   IRONPCTSAT 14 (L) 05/09/2018   Lab Results  Component Value Date   RETICCTPCT 1.2 12/25/2011   RBC 4.40 09/05/2018   RETICCTABS 49.8 12/25/2011   No results found for: KPAFRELGTCHN, LAMBDASER, KAPLAMBRATIO No results found for: IGGSERUM, IGA, IGMSERUM No results found for: Odetta Pink, SPEI   Chemistry      Component Value Date/Time   NA 138 05/09/2018 1252   NA 139 06/14/2017 1022   K 4.1 05/09/2018 1252   K 3.9 06/14/2017 1022   CL 101 05/09/2018 1252   CL 105 09/07/2016 1151   CO2 25 05/09/2018 1252   CO2 26 06/14/2017 1022   BUN 10 05/09/2018 1252   BUN 6.8 (L) 06/14/2017 1022   CREATININE 0.76 05/09/2018 1252   CREATININE 0.8 06/14/2017 1022      Component Value Date/Time   CALCIUM 9.8 05/09/2018 1252   CALCIUM 9.6 06/14/2017 1022   ALKPHOS 144 (H) 05/09/2018 1252   ALKPHOS 91 06/14/2017 1022   AST 16 05/09/2018 1252   AST 15 06/14/2017 1022   ALT 16 05/09/2018 1252   ALT 14 06/14/2017 1022   BILITOT 0.3 05/09/2018 1252   BILITOT 0.40 06/14/2017 1022       Impression and Plan: Denise Lawson is a very pleasant 63 yo caucasian female with 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  She has noted some mild fatigue and chills lately.  We will see what her iron studies show and bring her back in for infusion if needed.  We will plan to see her back in another 4 months.  She will contact our office with any questions or concerns. We can certainly see her sooner if need be.   Denise Peace, NP 2/3/20201:29 PM

## 2018-09-06 LAB — IRON AND TIBC
Iron: 53 ug/dL (ref 41–142)
Saturation Ratios: 21 % (ref 21–57)
TIBC: 255 ug/dL (ref 236–444)
UIBC: 202 ug/dL (ref 120–384)

## 2018-09-06 LAB — FERRITIN: Ferritin: 1422 ng/mL — ABNORMAL HIGH (ref 11–307)

## 2018-09-06 LAB — VITAMIN D 25 HYDROXY (VIT D DEFICIENCY, FRACTURES): Vit D, 25-Hydroxy: 10 ng/mL — ABNORMAL LOW (ref 30.0–100.0)

## 2018-09-07 ENCOUNTER — Other Ambulatory Visit: Payer: Self-pay | Admitting: Physician Assistant

## 2018-09-07 DIAGNOSIS — R5381 Other malaise: Secondary | ICD-10-CM

## 2018-09-13 ENCOUNTER — Other Ambulatory Visit: Payer: Self-pay | Admitting: Physician Assistant

## 2018-09-13 DIAGNOSIS — E2839 Other primary ovarian failure: Secondary | ICD-10-CM

## 2019-01-04 ENCOUNTER — Ambulatory Visit: Payer: Medicare HMO | Admitting: Family

## 2019-01-04 ENCOUNTER — Other Ambulatory Visit: Payer: Medicare HMO

## 2019-01-13 ENCOUNTER — Inpatient Hospital Stay: Payer: Medicare HMO

## 2019-01-13 ENCOUNTER — Ambulatory Visit: Payer: Medicare HMO | Admitting: Family

## 2019-01-24 ENCOUNTER — Inpatient Hospital Stay: Payer: Medicare HMO | Attending: Family

## 2019-01-24 ENCOUNTER — Inpatient Hospital Stay: Payer: Medicare HMO | Admitting: Family

## 2019-01-24 ENCOUNTER — Encounter: Payer: Self-pay | Admitting: Family

## 2019-01-24 ENCOUNTER — Other Ambulatory Visit: Payer: Self-pay

## 2019-01-24 VITALS — BP 111/59 | HR 74 | Temp 98.8°F | Resp 18 | Wt 100.8 lb

## 2019-01-24 DIAGNOSIS — Z79899 Other long term (current) drug therapy: Secondary | ICD-10-CM | POA: Diagnosis not present

## 2019-01-24 DIAGNOSIS — K50918 Crohn's disease, unspecified, with other complication: Secondary | ICD-10-CM | POA: Insufficient documentation

## 2019-01-24 DIAGNOSIS — D509 Iron deficiency anemia, unspecified: Secondary | ICD-10-CM | POA: Diagnosis not present

## 2019-01-24 DIAGNOSIS — R109 Unspecified abdominal pain: Secondary | ICD-10-CM | POA: Insufficient documentation

## 2019-01-24 DIAGNOSIS — K921 Melena: Secondary | ICD-10-CM | POA: Insufficient documentation

## 2019-01-24 DIAGNOSIS — K909 Intestinal malabsorption, unspecified: Secondary | ICD-10-CM

## 2019-01-24 DIAGNOSIS — K50113 Crohn's disease of large intestine with fistula: Secondary | ICD-10-CM

## 2019-01-24 DIAGNOSIS — E559 Vitamin D deficiency, unspecified: Secondary | ICD-10-CM

## 2019-01-24 DIAGNOSIS — R634 Abnormal weight loss: Secondary | ICD-10-CM | POA: Diagnosis not present

## 2019-01-24 DIAGNOSIS — D510 Vitamin B12 deficiency anemia due to intrinsic factor deficiency: Secondary | ICD-10-CM

## 2019-01-24 LAB — CMP (CANCER CENTER ONLY)
ALT: 10 U/L (ref 0–44)
AST: 10 U/L — ABNORMAL LOW (ref 15–41)
Albumin: 3.7 g/dL (ref 3.5–5.0)
Alkaline Phosphatase: 96 U/L (ref 38–126)
Anion gap: 8 (ref 5–15)
BUN: 7 mg/dL — ABNORMAL LOW (ref 8–23)
CO2: 28 mmol/L (ref 22–32)
Calcium: 9.5 mg/dL (ref 8.9–10.3)
Chloride: 102 mmol/L (ref 98–111)
Creatinine: 0.79 mg/dL (ref 0.44–1.00)
GFR, Est AFR Am: >60 mL/min (ref >60)
GFR, Estimated: >60 mL/min (ref >60)
Glucose, Bld: 101 mg/dL — ABNORMAL HIGH (ref 70–99)
Potassium: 3.6 mmol/L (ref 3.5–5.1)
Sodium: 138 mmol/L (ref 135–145)
Total Bilirubin: 0.3 mg/dL (ref 0.3–1.2)
Total Protein: 6.8 g/dL (ref 6.5–8.1)

## 2019-01-24 LAB — CBC WITH DIFFERENTIAL (CANCER CENTER ONLY)
Abs Immature Granulocytes: 0.02 10*3/uL (ref 0.00–0.07)
Basophils Absolute: 0 10*3/uL (ref 0.0–0.1)
Basophils Relative: 1 %
Eosinophils Absolute: 0.3 10*3/uL (ref 0.0–0.5)
Eosinophils Relative: 3 %
HCT: 40.3 % (ref 36.0–46.0)
Hemoglobin: 12.5 g/dL (ref 12.0–15.0)
Immature Granulocytes: 0 %
Lymphocytes Relative: 25 %
Lymphs Abs: 2.1 10*3/uL (ref 0.7–4.0)
MCH: 30.7 pg (ref 26.0–34.0)
MCHC: 31 g/dL (ref 30.0–36.0)
MCV: 99 fL (ref 80.0–100.0)
Monocytes Absolute: 0.7 10*3/uL (ref 0.1–1.0)
Monocytes Relative: 8 %
Neutro Abs: 5.5 10*3/uL (ref 1.7–7.7)
Neutrophils Relative %: 63 %
Platelet Count: 261 10*3/uL (ref 150–400)
RBC: 4.07 MIL/uL (ref 3.87–5.11)
RDW: 13 % (ref 11.5–15.5)
WBC Count: 8.6 10*3/uL (ref 4.0–10.5)
nRBC: 0 % (ref 0.0–0.2)

## 2019-01-24 LAB — VITAMIN B12: Vitamin B-12: 223 pg/mL (ref 180–914)

## 2019-01-24 NOTE — Progress Notes (Signed)
Hematology and Oncology Follow Up Visit  Denise Lawson [id number] 05/14/1956 63 y.o. 01/24/2019   Principle Diagnosis:  Recurrent iron deficiency anemia Pernicious anemia Crohn's disease - exacerbation  Current Therapy:   IV iron as indicated - last received inJune2019 B12 1,000 mcg IM monthly - self   Interim History:  Denise Lawson is here today for follow-up. She is in the midst of a flare with her Crohn's. She states that both she and her husband got sick from some food they ate recently. 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 No other bleeding.  She has intermittent abdominal pains.  She has a follow-up appointment coming up with GI Dr. Elnoria HowardHung. She also recently restarted low dose Naltrexone for her Crohn's. Hopefully this will help.  Her weight is down 8 lbs since her last visit.  She is hydrating well and her appetite comes and goes. No fever, chills, n/v, cough, rash, dizziness, SOB, chest pain, palpitations or changes in bladder habits.  No swelling, tenderness, numbness or tingling in her extremities.  No lymphadenopathy noted on exam.   ECOG Performance Status: 1 - Symptomatic but completely ambulatory  Medications:  Allergies as of 01/24/2019      Reactions   Azithromycin Itching, Rash   Darvon [propoxyphene Hcl]    Hives   Iohexol Nausea And Vomiting    Desc: V[facility]ING-10 YRS AGO [facility]   Propoxyphene Hives, Itching   Morphine And Related Other (See Comments)   Oseltamivir Rash      Medication List       Accurate as of January 24, 2019  1:22 PM. If you have any questions, ask your nurse or doctor.        STOP taking these medications   CITRACAL/VITAMIN D PO Stopped by: Emeline GinsSarah Taria Castrillo, NP     TAKE these medications   amantadine 100 MG capsule Commonly known as: SYMMETREL Take by mouth.   azelastine 0.1 % nasal spray Commonly known as: ASTELIN    CALCIUM 1200+D3 PO Take by mouth.   cyanocobalamin 1000 MCG/ML injection Commonly known as: (VITAMIN B-12) Inject 1 mL (1,000 mcg total) into the muscle every 30 (thirty) days.   DEPADE PO Take 4.5 mg by mouth.   dimenhyDRINATE 50 MG tablet Commonly known as: DRAMAMINE Take 50 mg by mouth as needed.   ergocalciferol 1.25 MG (50000 UT) capsule Commonly known as: VITAMIN D2 Take 1 capsule (50,000 Units total) 2 (two) times a week by mouth.   ibuprofen 200 MG tablet Commonly known as: ADVIL Take 400 mg by mouth every 6 (six) hours as needed for pain.   nitrofurantoin 50 MG capsule Commonly known as: MACRODANTIN Take 50 mg by mouth daily.   PHENERGAN PO Take 25 mg by mouth as needed. Reported on 11/27/2015   sulfamethoxazole-trimethoprim 800-160 MG tablet Commonly known as: BACTRIM DS       Allergies:  Allergies  Allergen Reactions  . Azithromycin Itching and Rash  . Darvon [Propoxyphene Hcl]     Hives  . Iohexol Nausea And Vomiting     Desc: VOM[facility]ING-10 YRS AGO [facility]  Marland Kitchen. Propoxyphene Hives and Itching  . Morphine And Related Other (See Comments)  . Oseltamivir Rash    Past Medical History, Surgical history, Social history, and Family History were reviewed and updated.  Review of Systems: All other 10 point review of systems is  negative.   Physical Exam:  weight is 100 lb 12.8 oz (45.7 kg). Her oral temperature is 98.8 F (37.1 C). Her blood pressure is 111/59 (abnormal) and her pulse is 74. Her respiration is 18 and oxygen saturation is 100%.   Wt Readings from Last 3 Encounters:  01/24/19 100 lb 12.8 oz (45.7 kg)  09/05/18 108 lb 12.8 oz (49.4 kg)  05/09/18 95 lb 8 oz (43.3 kg)    Ocular: Sclerae unicteric, pupils equal, round and reactive to light Ear-nose-throat: Oropharynx clear, dentition fair Lymphatic: No cervical or supraclavicular adenopathy Lungs no rales or rhonchi, good excursion bilaterally Heart regular rate and rhythm, no  murmur appreciated Abd soft, nontender, positive bowel sounds, no liver or spleen tip palpated on exam, no fluid wave  MSK no focal spinal tenderness, no joint edema Neuro: non-focal, well-oriented, appropriate affect Breasts: Deferred   Lab Results  Component Value Date   WBC 8.6 01/24/2019   HGB 12.5 01/24/2019   HCT 40.3 01/24/2019   MCV 99.0 01/24/2019   PLT 261 01/24/2019   Lab Results  Component Value Date   FERRITIN 1,422 (H) 09/05/2018   IRON 53 09/05/2018   TIBC 255 09/05/2018   UIBC 202 09/05/2018   IRONPCTSAT 21 09/05/2018   Lab Results  Component Value Date   RETICCTPCT 1.2 12/25/2011   RBC 4.07 01/24/2019   RETICCTABS 49.8 12/25/2011   No results found for: KPAFRELGTCHN, LAMBDASER, KAPLAMBRATIO No results found for: IGGSERUM, IGA, IGMSERUM No results found for: Odetta Pink, SPEI   Chemistry      Component Value Date/Time   NA 140 09/05/2018 1303   NA 139 06/14/2017 1022   K 3.7 09/05/2018 1303   K 3.9 06/14/2017 1022   CL 103 09/05/2018 1303   CL 105 09/07/2016 1151   CO2 28 09/05/2018 1303   CO2 26 06/14/2017 1022   BUN 8 09/05/2018 1303   BUN 6.8 (L) 06/14/2017 1022   CREATININE 0.77 09/05/2018 1303   CREATININE 0.8 06/14/2017 1022      Component Value Date/Time   CALCIUM 9.7 09/05/2018 1303   CALCIUM 9.6 06/14/2017 1022   ALKPHOS 104 09/05/2018 1303   ALKPHOS 91 06/14/2017 1022   AST 12 (L) 09/05/2018 1303   AST 15 06/14/2017 1022   ALT 11 09/05/2018 1303   ALT 14 06/14/2017 1022   BILITOT 0.3 09/05/2018 1303   BILITOT 0.40 06/14/2017 1022       Impression and Plan: Denise Lawson is a very pleasant 63 yo caucasian female with 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  She is having a flare with Crohn's after eating some food that didn't agree with her.  We will see what her iron studies show and bring her back in for infusion if needed.  We will go ahead  and plan to see her back in another 6 months.  She will contact our office with any questions or concerns. We can certainly see her sooner if needed.   [name], NP 6/23/20201:22 PM

## 2019-01-25 LAB — IRON AND TIBC
Iron: 38 ug/dL — ABNORMAL LOW (ref 41–142)
Saturation Ratios: 16 % — ABNORMAL LOW (ref 21–57)
TIBC: 239 ug/dL (ref 236–444)
UIBC: 201 ug/dL (ref 120–384)

## 2019-01-25 LAB — VITAMIN D 25 HYDROXY (VIT D DEFICIENCY, FRACTURES): Vit D, 25-Hydroxy: 24.1 ng/mL — ABNORMAL LOW (ref 30.0–100.0)

## 2019-01-25 LAB — FERRITIN: Ferritin: 1331 ng/mL — ABNORMAL HIGH (ref 11–307)

## 2019-01-31 ENCOUNTER — Telehealth: Payer: Self-pay | Admitting: Family

## 2019-01-31 NOTE — Telephone Encounter (Signed)
Called and spoke with patient regarding appointment added for Iron infusion.  Patient will be out of town for the week fo 7/6-7/10/ per 6/30 sch msh

## 2019-02-14 ENCOUNTER — Inpatient Hospital Stay: Payer: Medicare HMO | Attending: Family

## 2019-02-14 ENCOUNTER — Other Ambulatory Visit: Payer: Self-pay

## 2019-02-14 ENCOUNTER — Other Ambulatory Visit: Payer: Self-pay | Admitting: Family

## 2019-02-14 VITALS — BP 110/59 | HR 87 | Temp 98.5°F | Resp 18

## 2019-02-14 DIAGNOSIS — D509 Iron deficiency anemia, unspecified: Secondary | ICD-10-CM

## 2019-02-14 DIAGNOSIS — Z91048 Other nonmedicinal substance allergy status: Secondary | ICD-10-CM

## 2019-02-14 DIAGNOSIS — E538 Deficiency of other specified B group vitamins: Secondary | ICD-10-CM | POA: Diagnosis not present

## 2019-02-14 DIAGNOSIS — M62830 Muscle spasm of back: Secondary | ICD-10-CM

## 2019-02-14 DIAGNOSIS — Z79899 Other long term (current) drug therapy: Secondary | ICD-10-CM | POA: Insufficient documentation

## 2019-02-14 MED ORDER — METHYLPREDNISOLONE SODIUM SUCC 125 MG IJ SOLR
60.0000 mg | Freq: Once | INTRAMUSCULAR | Status: AC
  Administered 2019-02-14: 60 mg via INTRAVENOUS

## 2019-02-14 MED ORDER — METHYLPREDNISOLONE SODIUM SUCC 125 MG IJ SOLR: 60.0000 mg | Freq: Once | INTRAMUSCULAR | Status: DC

## 2019-02-14 MED ORDER — SODIUM CHLORIDE 0.9 % IV SOLN
INTRAVENOUS | Status: DC
  Administered 2019-02-14: 13:00:00 via INTRAVENOUS
  Filled 2019-02-14: qty 250

## 2019-02-14 MED ORDER — DIAZEPAM 5 MG/ML IJ SOLN
INTRAMUSCULAR | Status: AC
  Filled 2019-02-14: qty 2

## 2019-02-14 MED ORDER — DIAZEPAM 5 MG/ML IJ SOLN
5.0000 mg | Freq: Once | INTRAMUSCULAR | Status: AC
  Administered 2019-02-14: 5 mg via INTRAVENOUS

## 2019-02-14 MED ORDER — DIPHENHYDRAMINE HCL 50 MG/ML IJ SOLN: 12.5000 mg | Freq: Once | INTRAMUSCULAR | Status: DC

## 2019-02-14 MED ORDER — DIAZEPAM 5 MG/ML IJ SOLN: 5.0000 mg | Freq: Once | INTRAMUSCULAR | Status: DC

## 2019-02-14 MED ORDER — CYCLOBENZAPRINE HCL 5 MG PO TABS: 5.0000 mg | ORAL_TABLET | Freq: Three times a day (TID) | ORAL | 0 refills | Status: DC | PRN

## 2019-02-14 MED ORDER — CYANOCOBALAMIN 1000 MCG/ML IJ SOLN
INTRAMUSCULAR | Status: AC
  Filled 2019-02-14: qty 1

## 2019-02-14 MED ORDER — CYANOCOBALAMIN 1000 MCG/ML IJ SOLN
1000.0000 ug | Freq: Once | INTRAMUSCULAR | Status: AC
  Administered 2019-02-14: 1000 ug via INTRAMUSCULAR

## 2019-02-14 MED ORDER — DIPHENHYDRAMINE HCL 50 MG/ML IJ SOLN
12.5000 mg | Freq: Once | INTRAMUSCULAR | Status: AC
  Administered 2019-02-14: 12.5 mg via INTRAVENOUS

## 2019-02-14 MED ORDER — SODIUM CHLORIDE 0.9 % IV SOLN
510.0000 mg | Freq: Once | INTRAVENOUS | Status: AC
  Administered 2019-02-14: 510 mg via INTRAVENOUS
  Filled 2019-02-14: qty 510

## 2019-02-14 NOTE — Progress Notes (Signed)
Approx. 10 min after feraheme infusion was started pt. Said "am not feeling well, my back hurts". Scherrie Bateman, NP called. Benadryl, Solu-Medrol and valium given per Sarah's orders ( see MAR). IV fluids were given ( 250 ml of NS). Upon discharge patient feeling batter but states that she still have "some cramps in her back". Judson Roch was notified.

## 2019-02-14 NOTE — Patient Instructions (Signed)

## 2019-02-14 NOTE — Progress Notes (Signed)
Patient began experiencing painful back spasms that were palpable on exam about 10 minutes into her Feraheme infusion. This was stopped immediately and a saline line started. She received Benadryl and Solumedrol IV with no relief. Her symptoms resolved after receiving Valium IV and she is resting comfortably at this time. She has received feraheme multiple times in the past with no problem. I will check with Baxter Flattery our financial counselor to see if we can change her over to injectafer.  Her husband will come pick her up today.

## 2019-02-14 NOTE — Addendum Note (Signed)
Addended by: Eliezer Bottom on: 02/14/2019 02:55 PM   Modules accepted: Orders

## 2019-02-14 NOTE — Progress Notes (Signed)
Patient okay for Feraheme today per Otilio Carpen, Financial Advocate.

## 2019-02-23 ENCOUNTER — Telehealth: Payer: Self-pay | Admitting: Family

## 2019-02-23 NOTE — Telephone Encounter (Signed)
Called and LMVM for patient with date/time of next appointment per 7/22 sch msg

## 2019-03-03 ENCOUNTER — Inpatient Hospital Stay: Payer: Medicare HMO

## 2019-03-03 ENCOUNTER — Other Ambulatory Visit: Payer: Self-pay

## 2019-03-03 VITALS — BP 101/63 | HR 71 | Temp 98.9°F | Resp 18

## 2019-03-03 DIAGNOSIS — D509 Iron deficiency anemia, unspecified: Secondary | ICD-10-CM | POA: Diagnosis not present

## 2019-03-03 DIAGNOSIS — K909 Intestinal malabsorption, unspecified: Secondary | ICD-10-CM

## 2019-03-03 MED ORDER — SODIUM CHLORIDE 0.9 % IV SOLN
Freq: Once | INTRAVENOUS | Status: AC
  Administered 2019-03-03: 13:00:00 via INTRAVENOUS
  Filled 2019-03-03: qty 250

## 2019-03-03 MED ORDER — SODIUM CHLORIDE 0.9 % IV SOLN
750.0000 mg | Freq: Once | INTRAVENOUS | Status: AC
  Administered 2019-03-03: 750 mg via INTRAVENOUS
  Filled 2019-03-03: qty 15

## 2019-07-26 ENCOUNTER — Ambulatory Visit: Payer: Medicare HMO | Admitting: Hematology & Oncology

## 2019-07-26 ENCOUNTER — Other Ambulatory Visit: Payer: Medicare HMO

## 2019-08-31 ENCOUNTER — Inpatient Hospital Stay (HOSPITAL_BASED_OUTPATIENT_CLINIC_OR_DEPARTMENT_OTHER): Payer: Medicare HMO | Admitting: Family

## 2019-08-31 ENCOUNTER — Encounter: Payer: Self-pay | Admitting: Family

## 2019-08-31 ENCOUNTER — Other Ambulatory Visit: Payer: Self-pay

## 2019-08-31 ENCOUNTER — Inpatient Hospital Stay: Payer: Medicare HMO | Attending: Hematology & Oncology

## 2019-08-31 VITALS — BP 118/84 | HR 70 | Temp 97.8°F | Resp 18 | Ht 61.0 in | Wt 91.1 lb

## 2019-08-31 DIAGNOSIS — D509 Iron deficiency anemia, unspecified: Secondary | ICD-10-CM | POA: Diagnosis not present

## 2019-08-31 DIAGNOSIS — D510 Vitamin B12 deficiency anemia due to intrinsic factor deficiency: Secondary | ICD-10-CM

## 2019-08-31 DIAGNOSIS — Z79899 Other long term (current) drug therapy: Secondary | ICD-10-CM | POA: Insufficient documentation

## 2019-08-31 DIAGNOSIS — E559 Vitamin D deficiency, unspecified: Secondary | ICD-10-CM

## 2019-08-31 DIAGNOSIS — K909 Intestinal malabsorption, unspecified: Secondary | ICD-10-CM

## 2019-08-31 DIAGNOSIS — K50113 Crohn's disease of large intestine with fistula: Secondary | ICD-10-CM

## 2019-08-31 DIAGNOSIS — K5090 Crohn's disease, unspecified, without complications: Secondary | ICD-10-CM | POA: Insufficient documentation

## 2019-08-31 LAB — CBC WITH DIFFERENTIAL (CANCER CENTER ONLY)
Abs Immature Granulocytes: 0.02 10*3/uL (ref 0.00–0.07)
Basophils Absolute: 0 10*3/uL (ref 0.0–0.1)
Basophils Relative: 0 %
Eosinophils Absolute: 0.2 10*3/uL (ref 0.0–0.5)
Eosinophils Relative: 2 %
HCT: 40.7 % (ref 36.0–46.0)
Hemoglobin: 12.8 g/dL (ref 12.0–15.0)
Immature Granulocytes: 0 %
Lymphocytes Relative: 23 %
Lymphs Abs: 2.2 10*3/uL (ref 0.7–4.0)
MCH: 31.4 pg (ref 26.0–34.0)
MCHC: 31.4 g/dL (ref 30.0–36.0)
MCV: 99.8 fL (ref 80.0–100.0)
Monocytes Absolute: 0.8 10*3/uL (ref 0.1–1.0)
Monocytes Relative: 8 %
Neutro Abs: 6.7 10*3/uL (ref 1.7–7.7)
Neutrophils Relative %: 67 %
Platelet Count: 272 10*3/uL (ref 150–400)
RBC: 4.08 MIL/uL (ref 3.87–5.11)
RDW: 12.8 % (ref 11.5–15.5)
WBC Count: 10 10*3/uL (ref 4.0–10.5)
nRBC: 0 % (ref 0.0–0.2)

## 2019-08-31 LAB — CMP (CANCER CENTER ONLY)
ALT: 9 U/L (ref 0–44)
AST: 9 U/L — ABNORMAL LOW (ref 15–41)
Albumin: 3.6 g/dL (ref 3.5–5.0)
Alkaline Phosphatase: 105 U/L (ref 38–126)
Anion gap: 10 (ref 5–15)
BUN: 8 mg/dL (ref 8–23)
CO2: 29 mmol/L (ref 22–32)
Calcium: 9.5 mg/dL (ref 8.9–10.3)
Chloride: 103 mmol/L (ref 98–111)
Creatinine: 0.73 mg/dL (ref 0.44–1.00)
GFR, Est AFR Am: >60 mL/min (ref >60)
GFR, Estimated: >60 mL/min (ref >60)
Glucose, Bld: 101 mg/dL — ABNORMAL HIGH (ref 70–99)
Potassium: 3.5 mmol/L (ref 3.5–5.1)
Sodium: 142 mmol/L (ref 135–145)
Total Bilirubin: 0.3 mg/dL (ref 0.3–1.2)
Total Protein: 7.2 g/dL (ref 6.5–8.1)

## 2019-08-31 LAB — VITAMIN D 25 HYDROXY (VIT D DEFICIENCY, FRACTURES): Vit D, 25-Hydroxy: 19.25 ng/mL — ABNORMAL LOW (ref 30–100)

## 2019-08-31 LAB — VITAMIN B12: Vitamin B-12: 203 pg/mL (ref 180–914)

## 2019-08-31 NOTE — Progress Notes (Signed)
Hematology and Oncology Follow Up Visit  Denise Lawson [id number] 08-Jun-1956 64 y.o. 08/31/2019   Principle Diagnosis:  Recurrent iron deficiency anemia Pernicious anemia Crohn's disease - exacerbation  Current Therapy:   IV iron as indicated - last received inJune2019 B12 1,000 mcg IM monthly - self   Interim History:  Denise Lawson is here today for follow-up. She is has been having issues with recently Crohn's. She has noted blood in her stool. No other blood loss has been noted. No bruising or petechiae.  She has seen GI and they feel that she needs to have a colonoscopy but she will need to gain 20 lbs before they will do it. There is concern of perforating her bowel. This has happened in the past due to her small size.  She has occasional episodes of nausea and still gets the muscle spasms that now radiate up her back, over the shoulders and into the chest.  She is eating carnation instant breakfast shakes twice a day and then grazing throughout the day as this is easier for her. She just isn't able to sit down and eat a large meal.  She is staying well hydrated. Her weigh is down 9 lbs since we last saw her.  No fever, chills, vomiting, cough, rash, dizziness, SOB, chest pain, palpitations, or changes in bladder habits.  No swelling, tenderness, numbness or tingling in her extremities.   ECOG Performance Status: 1 - Symptomatic but completely ambulatory  Medications:  Allergies as of 08/31/2019      Reactions   Azithromycin Itching, Rash   Darvon [propoxyphene Hcl] Hives   Iohexol Nausea And Vomiting    Desc: VOMITING-10 YRS AGO [facility]   Propoxyphene Hives, Itching   Feraheme [ferumoxytol] Other (See Comments)   Severe back spasms   Morphine And Related Other (See Comments)   Oseltamivir Rash      Medication List       Accurate as of August 31, 2019  1:46 PM. If you have any questions, ask your nurse or doctor.        amantadine 100 MG capsule  Commonly known as: SYMMETREL Take by mouth.   azelastine 0.1 % nasal spray Commonly known as: ASTELIN   CALCIUM 1200+D3 PO Take by mouth.   cyanocobalamin 1000 MCG/ML injection Commonly known as: (VITAMIN B-12) Inject 1 mL (1,000 mcg total) into the muscle every 30 (thirty) days.   cyclobenzaprine 5 MG tablet Commonly known as: FLEXERIL Take 1 tablet (5 mg total) by mouth 3 (three) times daily as needed for muscle spasms.   DEPADE PO Take 4.5 mg by mouth.   dimenhyDRINATE 50 MG tablet Commonly known as: DRAMAMINE Take 50 mg by mouth as needed.   ergocalciferol 1.25 MG (50000 UT) capsule Commonly known as: VITAMIN D2 Take 1 capsule (50,000 Units total) 2 (two) times a week by mouth.   ibuprofen 200 MG tablet Commonly known as: ADVIL Take 400 mg by mouth every 6 (six) hours as needed for pain.   nitrofurantoin 50 MG capsule Commonly known as: MACRODANTIN Take 50 mg by mouth daily.   PHENERGAN PO Take 25 mg by mouth as needed. Reported on 11/27/2015   sulfamethoxazole-trimethoprim 800-160 MG tablet Commonly known as: BACTRIM DS       Allergies:  Allergies  Allergen Reactions  . Azithromycin Itching and Rash  . Darvon [Propoxyphene Hcl] Hives  . Iohexol Nausea And Vomiting     Desc: VOMITING-10 YRS AGO @ [facility]  Marland Kitchen  Propoxyphene Hives and Itching  . Feraheme [Ferumoxytol] Other (See Comments)    Severe back spasms  . Morphine And Related Other (See Comments)  . Oseltamivir Rash    Past Medical History, Surgical history, Social history, and Family History were reviewed and updated.  Review of Systems: All other 10 point review of systems is negative.   Physical Exam:  vitals were not taken for this visit.   Wt Readings from Last 3 Encounters:  01/24/19 100 lb 12.8 oz (45.7 kg)  09/05/18 108 lb 12.8 oz (49.4 kg)  05/09/18 95 lb 8 oz (43.3 kg)    Ocular: Sclerae unicteric, pupils equal, round and reactive to light Ear-nose-throat: Oropharynx  clear, dentition fair Lymphatic: No cervical or supraclavicular adenopathy Lungs no rales or rhonchi, good excursion bilaterally Heart regular rate and rhythm, no murmur appreciated Abd soft, nontender, positive bowel sounds, no liver or spleen tip palpated on exam, no fluid wave  MSK no focal spinal tenderness, no joint edema Neuro: non-focal, well-oriented, appropriate affect Breasts: Deferred   Lab Results  Component Value Date   WBC 10.0 08/31/2019   HGB 12.8 08/31/2019   HCT 40.7 08/31/2019   MCV 99.8 08/31/2019   PLT 272 08/31/2019   Lab Results  Component Value Date   FERRITIN 1,331 (H) 01/24/2019   IRON 38 (L) 01/24/2019   TIBC 239 01/24/2019   UIBC 201 01/24/2019   IRONPCTSAT 16 (L) 01/24/2019   Lab Results  Component Value Date   RETICCTPCT 1.2 12/25/2011   RBC 4.08 08/31/2019   RETICCTABS 49.8 12/25/2011   No results found for: KPAFRELGTCHN, LAMBDASER, KAPLAMBRATIO No results found for: IGGSERUM, IGA, IGMSERUM No results found for: Marda Stalker, SPEI   Chemistry      Component Value Date/Time   NA 138 01/24/2019 1251   NA 139 06/14/2017 1022   K 3.6 01/24/2019 1251   K 3.9 06/14/2017 1022   CL 102 01/24/2019 1251   CL 105 09/07/2016 1151   CO2 28 01/24/2019 1251   CO2 26 06/14/2017 1022   BUN 7 (L) 01/24/2019 1251   BUN 6.8 (L) 06/14/2017 1022   CREATININE 0.79 01/24/2019 1251   CREATININE 0.8 06/14/2017 1022      Component Value Date/Time   CALCIUM 9.5 01/24/2019 1251   CALCIUM 9.6 06/14/2017 1022   ALKPHOS 96 01/24/2019 1251   ALKPHOS 91 06/14/2017 1022   AST 10 (L) 01/24/2019 1251   AST 15 06/14/2017 1022   ALT 10 01/24/2019 1251   ALT 14 06/14/2017 1022   BILITOT 0.3 01/24/2019 1251   BILITOT 0.40 06/14/2017 1022       Impression and Plan: Denise Lawson is a very pleasant 64 yo caucasian female with 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 We will see  what her iron studies look like and replace if needed.  We will plan to see her in another 6 months.  She will contact our office with any questions or concerns. We can certainly see her sooner if needed.   Emeline Gins, NP 1/28/20211:46 PM

## 2019-09-01 LAB — IRON AND TIBC
Iron: 39 ug/dL — ABNORMAL LOW (ref 41–142)
Saturation Ratios: 19 % — ABNORMAL LOW (ref 21–57)
TIBC: 201 ug/dL — ABNORMAL LOW (ref 236–444)
UIBC: 162 ug/dL (ref 120–384)

## 2019-09-01 LAB — FERRITIN: Ferritin: 2384 ng/mL — ABNORMAL HIGH (ref 11–307)

## 2019-09-07 ENCOUNTER — Inpatient Hospital Stay: Payer: Medicare HMO | Attending: Hematology & Oncology

## 2019-09-07 ENCOUNTER — Other Ambulatory Visit: Payer: Self-pay

## 2019-09-07 VITALS — BP 120/65 | HR 106 | Temp 98.7°F

## 2019-09-07 DIAGNOSIS — Z79899 Other long term (current) drug therapy: Secondary | ICD-10-CM | POA: Insufficient documentation

## 2019-09-07 DIAGNOSIS — D509 Iron deficiency anemia, unspecified: Secondary | ICD-10-CM | POA: Diagnosis present

## 2019-09-07 DIAGNOSIS — K909 Intestinal malabsorption, unspecified: Secondary | ICD-10-CM

## 2019-09-07 MED ORDER — SODIUM CHLORIDE 0.9 % IV SOLN
Freq: Once | INTRAVENOUS | Status: AC
  Filled 2019-09-07: qty 250

## 2019-09-07 MED ORDER — SODIUM CHLORIDE 0.9 % IV SOLN
750.0000 mg | Freq: Once | INTRAVENOUS | Status: AC
  Administered 2019-09-07: 750 mg via INTRAVENOUS
  Filled 2019-09-07: qty 15

## 2019-09-07 NOTE — Patient Instructions (Signed)

## 2019-09-15 ENCOUNTER — Telehealth: Payer: Self-pay | Admitting: Family

## 2019-09-15 NOTE — Telephone Encounter (Signed)
Appointments scheduled LMVM per 1/28 los

## 2020-02-28 ENCOUNTER — Encounter: Payer: Self-pay | Admitting: Family

## 2020-02-28 ENCOUNTER — Inpatient Hospital Stay (HOSPITAL_BASED_OUTPATIENT_CLINIC_OR_DEPARTMENT_OTHER): Payer: Medicare HMO | Admitting: Family

## 2020-02-28 ENCOUNTER — Other Ambulatory Visit: Payer: Self-pay

## 2020-02-28 ENCOUNTER — Telehealth: Payer: Self-pay | Admitting: Family

## 2020-02-28 ENCOUNTER — Inpatient Hospital Stay: Payer: Medicare HMO | Attending: Hematology & Oncology

## 2020-02-28 DIAGNOSIS — D509 Iron deficiency anemia, unspecified: Secondary | ICD-10-CM | POA: Diagnosis present

## 2020-02-28 DIAGNOSIS — K50113 Crohn's disease of large intestine with fistula: Secondary | ICD-10-CM | POA: Diagnosis not present

## 2020-02-28 DIAGNOSIS — K5090 Crohn's disease, unspecified, without complications: Secondary | ICD-10-CM | POA: Diagnosis not present

## 2020-02-28 DIAGNOSIS — D510 Vitamin B12 deficiency anemia due to intrinsic factor deficiency: Secondary | ICD-10-CM | POA: Diagnosis not present

## 2020-02-28 DIAGNOSIS — E559 Vitamin D deficiency, unspecified: Secondary | ICD-10-CM

## 2020-02-28 DIAGNOSIS — K909 Intestinal malabsorption, unspecified: Secondary | ICD-10-CM | POA: Insufficient documentation

## 2020-02-28 DIAGNOSIS — R002 Palpitations: Secondary | ICD-10-CM | POA: Insufficient documentation

## 2020-02-28 DIAGNOSIS — R5383 Other fatigue: Secondary | ICD-10-CM | POA: Diagnosis not present

## 2020-02-28 DIAGNOSIS — K921 Melena: Secondary | ICD-10-CM | POA: Insufficient documentation

## 2020-02-28 LAB — CMP (CANCER CENTER ONLY)
ALT: 14 U/L (ref 0–44)
AST: 15 U/L (ref 15–41)
Albumin: 4 g/dL (ref 3.5–5.0)
Alkaline Phosphatase: 114 U/L (ref 38–126)
Anion gap: 9 (ref 5–15)
BUN: 16 mg/dL (ref 8–23)
CO2: 29 mmol/L (ref 22–32)
Calcium: 10.1 mg/dL (ref 8.9–10.3)
Chloride: 99 mmol/L (ref 98–111)
Creatinine: 0.85 mg/dL (ref 0.44–1.00)
GFR, Est AFR Am: >60 mL/min (ref >60)
GFR, Estimated: >60 mL/min (ref >60)
Glucose, Bld: 88 mg/dL (ref 70–99)
Potassium: 3.9 mmol/L (ref 3.5–5.1)
Sodium: 137 mmol/L (ref 135–145)
Total Bilirubin: 0.3 mg/dL (ref 0.3–1.2)
Total Protein: 7.9 g/dL (ref 6.5–8.1)

## 2020-02-28 LAB — RETICULOCYTES
Immature Retic Fract: 5.9 % (ref 2.3–15.9)
RBC.: 4.51 MIL/uL (ref 3.87–5.11)
Retic Count, Absolute: 85.7 10*3/uL (ref 19.0–186.0)
Retic Ct Pct: 1.9 % (ref 0.4–3.1)

## 2020-02-28 LAB — CBC WITH DIFFERENTIAL (CANCER CENTER ONLY)
Abs Immature Granulocytes: 0.04 10*3/uL (ref 0.00–0.07)
Basophils Absolute: 0.1 10*3/uL (ref 0.0–0.1)
Basophils Relative: 1 %
Eosinophils Absolute: 0.3 10*3/uL (ref 0.0–0.5)
Eosinophils Relative: 2 %
HCT: 44.7 % (ref 36.0–46.0)
Hemoglobin: 13.8 g/dL (ref 12.0–15.0)
Immature Granulocytes: 0 %
Lymphocytes Relative: 22 %
Lymphs Abs: 2.3 10*3/uL (ref 0.7–4.0)
MCH: 30.3 pg (ref 26.0–34.0)
MCHC: 30.9 g/dL (ref 30.0–36.0)
MCV: 98.2 fL (ref 80.0–100.0)
Monocytes Absolute: 0.9 10*3/uL (ref 0.1–1.0)
Monocytes Relative: 8 %
Neutro Abs: 7.1 10*3/uL (ref 1.7–7.7)
Neutrophils Relative %: 67 %
Platelet Count: 305 10*3/uL (ref 150–400)
RBC: 4.55 MIL/uL (ref 3.87–5.11)
RDW: 13.2 % (ref 11.5–15.5)
WBC Count: 10.6 10*3/uL — ABNORMAL HIGH (ref 4.0–10.5)
nRBC: 0 % (ref 0.0–0.2)

## 2020-02-28 LAB — VITAMIN B12: Vitamin B-12: 190 pg/mL (ref 180–914)

## 2020-02-28 LAB — VITAMIN D 25 HYDROXY (VIT D DEFICIENCY, FRACTURES): Vit D, 25-Hydroxy: 15.01 ng/mL — ABNORMAL LOW (ref 30–100)

## 2020-02-28 MED ORDER — CYANOCOBALAMIN 1000 MCG/ML IJ SOLN: 1000.0000 ug | INTRAMUSCULAR | 1 refills | Status: DC

## 2020-02-28 MED ORDER — ERGOCALCIFEROL 1.25 MG (50000 UT) PO CAPS: 50000.0000 [IU] | ORAL_CAPSULE | ORAL | 8 refills | Status: DC

## 2020-02-28 NOTE — Progress Notes (Signed)
Hematology and Oncology Follow Up Visit  Denise Lawson [id number] 24-Sep-1955 64 y.o. 02/28/2020   Principle Diagnosis:  Recurrent iron deficiency anemia Pernicious anemia Crohn's disease - exacerbation  Current Therapy:        IV iron as indicated - last received inJune2019 B12 1,000 mcg IM monthly - self   Interim History:  Denise Lawson is here today for follow-up. She is feeling fatigued at times.  She has been having some blood in her stool off and on with intermittent Crohn's flares.  No other blood loss noted. No bruising or petechiae.  She states that GI still needs her to gain weight before they can do another colonoscopy.  She states that when eating she fills up after only a few bites so typically just grazes throughout the day.  She is hydrating and weight is stable at 91 lbs.  She has occasional palpitations with over exertion.  No fever, chills, cough, rash, dizziness, SOB, chest pain or changes in bladder habits.  No swelling, tenderness, numbness or tingling in her extremities.  No falls or syncope.  She has maintained a good appetite and is staying well hydrated. Her weight is stable.   ECOG Performance Status: 1 - Symptomatic but completely ambulatory  Medications:  Allergies as of 02/28/2020      Reactions   Azithromycin Itching, Rash   Darvon [propoxyphene Hcl] Hives   Iohexol Nausea And Vomiting    Desc: VOMITING-10 YRS AGO [facility]   Propoxyphene Hives, Itching   Feraheme [ferumoxytol] Other (See Comments)   Severe back spasms   Morphine And Related Other (See Comments)   Oseltamivir Rash      Medication List       Accurate as of February 28, 2020  1:22 PM. If you have any questions, ask your nurse or doctor.        CALCIUM 1200+D3 PO Take by mouth.   cyanocobalamin 1000 MCG/ML injection Commonly known as: (VITAMIN B-12) Inject 1 mL (1,000 mcg total) into the muscle every 30 (thirty) days.   cyclobenzaprine 5 MG tablet Commonly  known as: FLEXERIL Take 1 tablet (5 mg total) by mouth 3 (three) times daily as needed for muscle spasms.   DEPADE PO Take 4.5 mg by mouth.   dimenhyDRINATE 50 MG tablet Commonly known as: DRAMAMINE Take 50 mg by mouth as needed.   ergocalciferol 1.25 MG (50000 UT) capsule Commonly known as: VITAMIN D2 Take 1 capsule (50,000 Units total) 2 (two) times a week by mouth.   ibuprofen 200 MG tablet Commonly known as: ADVIL Take 400 mg by mouth every 6 (six) hours as needed for pain.       Allergies:  Allergies  Allergen Reactions  . Azithromycin Itching and Rash  . Darvon [Propoxyphene Hcl] Hives  . Iohexol Nausea And Vomiting     Desc: VOM[facility]ING-10 YRS AGO [facility]  Marland Kitchen Propoxyphene Hives and Itching  . Feraheme [Ferumoxytol] Other (See Comments)    Severe back spasms  . Morphine And Related Other (See Comments)  . Oseltamivir Rash    Past Medical History, Surgical history, Social history, and Family History were reviewed and updated.  Review of Systems: All other 10 point review of systems is negative.   Physical Exam:  vitals were not taken for this visit.   Wt Readings from Last 3 Encounters:  08/31/19 91 lb 1.9 oz (41.3 kg)  01/24/19 100 lb 12.8 oz (45.7 kg)  09/05/18 108 lb 12.8 oz (49.4  kg)    Ocular: Sclerae unicteric, pupils equal, round and reactive to light Ear-nose-throat: Oropharynx clear, dentition fair Lymphatic: No cervical or supraclavicular adenopathy Lungs no rales or rhonchi, good excursion bilaterally Heart regular rate and rhythm, no murmur appreciated Abd soft, nontender, positive bowel sounds, no liver or spleen tip palpated on exam, no fluid wave  MSK no focal spinal tenderness, no joint edema Neuro: non-focal, well-oriented, appropriate affect Breasts: Deferred   Lab Results  Component Value Date   WBC 10.6 (H) 02/28/2020   HGB 13.8 02/28/2020   HCT 44.7 02/28/2020   MCV 98.2 02/28/2020   PLT 305 02/28/2020   Lab Results    Component Value Date   FERRITIN 2,384 (H) 08/31/2019   IRON 39 (L) 08/31/2019   TIBC 201 (L) 08/31/2019   UIBC 162 08/31/2019   IRONPCTSAT 19 (L) 08/31/2019   Lab Results  Component Value Date   RETICCTPCT 1.9 02/28/2020   RBC 4.51 02/28/2020   RBC 4.55 02/28/2020   RETICCTABS 49.8 12/25/2011   No results found for: KPAFRELGTCHN, LAMBDASER, KAPLAMBRATIO No results found for: IGGSERUM, IGA, IGMSERUM No results found for: Odetta Pink, SPEI   Chemistry      Component Value Date/Time   NA 142 08/31/2019 1323   NA 139 06/14/2017 1022   K 3.5 08/31/2019 1323   K 3.9 06/14/2017 1022   CL 103 08/31/2019 1323   CL 105 09/07/2016 1151   CO2 29 08/31/2019 1323   CO2 26 06/14/2017 1022   BUN 8 08/31/2019 1323   BUN 6.8 (L) 06/14/2017 1022   CREATININE 0.73 08/31/2019 1323   CREATININE 0.8 06/14/2017 1022      Component Value Date/Time   CALCIUM 9.5 08/31/2019 1323   CALCIUM 9.6 06/14/2017 1022   ALKPHOS 105 08/31/2019 1323   ALKPHOS 91 06/14/2017 1022   AST 9 (L) 08/31/2019 1323   AST 15 06/14/2017 1022   ALT 9 08/31/2019 1323   ALT 14 06/14/2017 1022   BILITOT 0.3 08/31/2019 1323   BILITOT 0.40 06/14/2017 1022       Impression and Plan: Denise Lawson is a very pleasant 64 yo caucasian female with 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 We will see what her iron studies show and replace if needed.  B12 and vitamin D supplement refill requests sent to her new [facility] pharmacy in [facility].  We will see her again in 6 months.  She will contact our office with any questions or concerns.  [name], NP 7/28/20211:22 PM

## 2020-02-28 NOTE — Telephone Encounter (Signed)
Appointments scheduled calendar printed & mailed per 7/28 los

## 2020-02-29 ENCOUNTER — Telehealth: Payer: Self-pay | Admitting: Family

## 2020-02-29 LAB — IRON AND TIBC
Iron: 44 ug/dL (ref 41–142)
Saturation Ratios: 17 % — ABNORMAL LOW (ref 21–57)
TIBC: 263 ug/dL (ref 236–444)
UIBC: 219 ug/dL (ref 120–384)

## 2020-02-29 LAB — FERRITIN: Ferritin: 2336 ng/mL — ABNORMAL HIGH (ref 11–307)

## 2020-02-29 NOTE — Telephone Encounter (Signed)
Called and spoke with patient regarding appointment added .  She was ok with both date/time per 7/29 scg msg

## 2020-03-04 ENCOUNTER — Inpatient Hospital Stay: Payer: Medicare HMO | Attending: Hematology & Oncology

## 2020-03-04 ENCOUNTER — Other Ambulatory Visit: Payer: Self-pay

## 2020-03-04 VITALS — BP 107/65 | HR 74 | Temp 98.0°F | Resp 17

## 2020-03-04 DIAGNOSIS — Z79899 Other long term (current) drug therapy: Secondary | ICD-10-CM | POA: Diagnosis not present

## 2020-03-04 DIAGNOSIS — D509 Iron deficiency anemia, unspecified: Secondary | ICD-10-CM | POA: Diagnosis not present

## 2020-03-04 DIAGNOSIS — K909 Intestinal malabsorption, unspecified: Secondary | ICD-10-CM

## 2020-03-04 MED ORDER — SODIUM CHLORIDE 0.9 % IV SOLN
750.0000 mg | Freq: Once | INTRAVENOUS | Status: AC
  Administered 2020-03-04: 750 mg via INTRAVENOUS
  Filled 2020-03-04: qty 15

## 2020-03-04 NOTE — Patient Instructions (Signed)

## 2020-08-19 ENCOUNTER — Other Ambulatory Visit: Payer: Self-pay | Admitting: Family

## 2020-08-19 DIAGNOSIS — E559 Vitamin D deficiency, unspecified: Secondary | ICD-10-CM

## 2020-08-19 DIAGNOSIS — K50113 Crohn's disease of large intestine with fistula: Secondary | ICD-10-CM

## 2020-08-19 DIAGNOSIS — D510 Vitamin B12 deficiency anemia due to intrinsic factor deficiency: Secondary | ICD-10-CM

## 2020-08-19 DIAGNOSIS — K909 Intestinal malabsorption, unspecified: Secondary | ICD-10-CM

## 2020-08-27 ENCOUNTER — Telehealth: Payer: Self-pay

## 2020-08-27 NOTE — Telephone Encounter (Signed)
Pt called and left a message to r/s her 1/28 appts to 2/3, called and left a vm with new times and to call back if needed    Tevin Shillingford

## 2020-08-27 NOTE — Telephone Encounter (Signed)
appts r/s to 2/7 to have appt same day as husband with Ronnie Doss

## 2020-08-30 ENCOUNTER — Ambulatory Visit: Payer: Medicare HMO | Admitting: Family

## 2020-08-30 ENCOUNTER — Other Ambulatory Visit: Payer: Medicare HMO

## 2020-09-03 ENCOUNTER — Telehealth: Payer: Self-pay

## 2020-09-03 NOTE — Telephone Encounter (Signed)
appts moved to 09/18/20 and pt is aware as Denise Lawson will not be in the office 09/09/20   Stancil Deisher

## 2020-09-05 ENCOUNTER — Other Ambulatory Visit: Payer: Medicare Other

## 2020-09-05 ENCOUNTER — Ambulatory Visit: Payer: Self-pay | Admitting: Family

## 2020-09-09 ENCOUNTER — Other Ambulatory Visit: Payer: Medicare Other

## 2020-09-09 ENCOUNTER — Ambulatory Visit: Payer: Self-pay | Admitting: Family

## 2020-09-18 ENCOUNTER — Other Ambulatory Visit: Payer: Self-pay

## 2020-09-18 ENCOUNTER — Inpatient Hospital Stay: Payer: Medicare Other | Attending: Hematology & Oncology

## 2020-09-18 ENCOUNTER — Encounter: Payer: Self-pay | Admitting: Family

## 2020-09-18 ENCOUNTER — Inpatient Hospital Stay (HOSPITAL_BASED_OUTPATIENT_CLINIC_OR_DEPARTMENT_OTHER): Payer: Medicare Other | Admitting: Family

## 2020-09-18 VITALS — BP 110/57 | HR 76 | Temp 98.2°F | Resp 18 | Ht 61.0 in | Wt 94.4 lb

## 2020-09-18 DIAGNOSIS — K909 Intestinal malabsorption, unspecified: Secondary | ICD-10-CM | POA: Insufficient documentation

## 2020-09-18 DIAGNOSIS — Z79899 Other long term (current) drug therapy: Secondary | ICD-10-CM | POA: Insufficient documentation

## 2020-09-18 DIAGNOSIS — D508 Other iron deficiency anemias: Secondary | ICD-10-CM | POA: Diagnosis present

## 2020-09-18 DIAGNOSIS — Z91048 Other nonmedicinal substance allergy status: Secondary | ICD-10-CM

## 2020-09-18 DIAGNOSIS — E559 Vitamin D deficiency, unspecified: Secondary | ICD-10-CM

## 2020-09-18 DIAGNOSIS — K5090 Crohn's disease, unspecified, without complications: Secondary | ICD-10-CM | POA: Diagnosis not present

## 2020-09-18 DIAGNOSIS — D510 Vitamin B12 deficiency anemia due to intrinsic factor deficiency: Secondary | ICD-10-CM

## 2020-09-18 DIAGNOSIS — D509 Iron deficiency anemia, unspecified: Secondary | ICD-10-CM | POA: Diagnosis not present

## 2020-09-18 DIAGNOSIS — M62830 Muscle spasm of back: Secondary | ICD-10-CM

## 2020-09-18 DIAGNOSIS — K50113 Crohn's disease of large intestine with fistula: Secondary | ICD-10-CM

## 2020-09-18 LAB — RETICULOCYTES
Immature Retic Fract: 4.6 % (ref 2.3–15.9)
RBC.: 4.22 MIL/uL (ref 3.87–5.11)
Retic Count, Absolute: 60.3 10*3/uL (ref 19.0–186.0)
Retic Ct Pct: 1.4 % (ref 0.4–3.1)

## 2020-09-18 LAB — CBC WITH DIFFERENTIAL (CANCER CENTER ONLY)
Abs Immature Granulocytes: 0.04 10*3/uL (ref 0.00–0.07)
Basophils Absolute: 0.1 10*3/uL (ref 0.0–0.1)
Basophils Relative: 1 %
Eosinophils Absolute: 0.2 10*3/uL (ref 0.0–0.5)
Eosinophils Relative: 2 %
HCT: 41.7 % (ref 36.0–46.0)
Hemoglobin: 13 g/dL (ref 12.0–15.0)
Immature Granulocytes: 0 %
Lymphocytes Relative: 21 %
Lymphs Abs: 1.9 10*3/uL (ref 0.7–4.0)
MCH: 30.7 pg (ref 26.0–34.0)
MCHC: 31.2 g/dL (ref 30.0–36.0)
MCV: 98.3 fL (ref 80.0–100.0)
Monocytes Absolute: 0.5 10*3/uL (ref 0.1–1.0)
Monocytes Relative: 6 %
Neutro Abs: 6.5 10*3/uL (ref 1.7–7.7)
Neutrophils Relative %: 70 %
Platelet Count: 251 10*3/uL (ref 150–400)
RBC: 4.24 MIL/uL (ref 3.87–5.11)
RDW: 12.8 % (ref 11.5–15.5)
WBC Count: 9.2 10*3/uL (ref 4.0–10.5)
nRBC: 0 % (ref 0.0–0.2)

## 2020-09-18 LAB — CMP (CANCER CENTER ONLY)
ALT: 9 U/L (ref 0–44)
AST: 11 U/L — ABNORMAL LOW (ref 15–41)
Albumin: 4 g/dL (ref 3.5–5.0)
Alkaline Phosphatase: 88 U/L (ref 38–126)
Anion gap: 8 (ref 5–15)
BUN: 14 mg/dL (ref 8–23)
CO2: 29 mmol/L (ref 22–32)
Calcium: 9.8 mg/dL (ref 8.9–10.3)
Chloride: 100 mmol/L (ref 98–111)
Creatinine: 0.86 mg/dL (ref 0.44–1.00)
GFR, Estimated: >60 mL/min (ref >60)
Glucose, Bld: 141 mg/dL — ABNORMAL HIGH (ref 70–99)
Potassium: 3.5 mmol/L (ref 3.5–5.1)
Sodium: 137 mmol/L (ref 135–145)
Total Bilirubin: 0.3 mg/dL (ref 0.3–1.2)
Total Protein: 7.4 g/dL (ref 6.5–8.1)

## 2020-09-18 LAB — VITAMIN D 25 HYDROXY (VIT D DEFICIENCY, FRACTURES): Vit D, 25-Hydroxy: 21.75 ng/mL — ABNORMAL LOW (ref 30–100)

## 2020-09-18 LAB — VITAMIN B12: Vitamin B-12: 142 pg/mL — ABNORMAL LOW (ref 180–914)

## 2020-09-18 NOTE — Progress Notes (Signed)
Hematology and Oncology Follow Up Visit  Denise Lawson [id number] 1956/01/27 65 y.o. 09/18/2020   Principle Diagnosis:  Recurrent iron deficiency anemia Pernicious anemia Crohn's disease - exacerbation  Current Therapy: IV iron as indicated - last received inJune2019 B12 1,000 mcg IM monthly - self   Interim History:  Ms. Rosell is here today with her husband for follow-up. She is doing fairly well but continues to have flares with Crohn's. She is currently on Naltrexone and feels that this has helped some.  She notes blood in her stool with straining and hemorrhoids. No bruising or petechiae.  She notes fatigue and chills at times.  She has nausea and has had a few recent episodes of vomiting when she first wakes up in the morning. She denies GERD and will try sleeping on a slight incline.  No fever, cough, rash, dizziness, SOB, chest pain, palpitations or changes in bladder habits.  No tenderness numbness or tingling in her extremities.  She has some swelling in the joints of her hands that waxes and wanes.  No falls or syncope to report.  Her appetite comes and goes but she is doing her best to stay well hydrated. Her weight is stable.  [place]   ECOG Performance Status: 1 - Symptomatic but completely ambulatory  Medications:  Allergies as of 09/18/2020      Reactions   Azithromycin Itching, Rash   Darvon [propoxyphene Hcl] Hives   Iohexol Nausea And Vomiting    Desc: VOMITING-10 YRS AGO [facility]   Propoxyphene Hives, Itching   Feraheme [ferumoxytol] Other (See Comments)   Severe back spasms   Morphine And Related Other (See Comments)   Oseltamivir Rash      Medication List       Accurate as of September 18, 2020  3:22 PM. If you have any questions, ask your nurse or doctor.        CALCIUM 1200+D3 PO Take by mouth.   cyanocobalamin 1000 MCG/ML injection Commonly  known as: (VITAMIN B-12) INJECT 1 ML (1,000 MCG TOTAL) INTO THE MUSCLE EVERY 30 (THIRTY) DAYS.   cyclobenzaprine 5 MG tablet Commonly known as: FLEXERIL Take 1 tablet (5 mg total) by mouth 3 (three) times daily as needed for muscle spasms.   DEPADE PO Take 4.5 mg by mouth.   dimenhyDRINATE 50 MG tablet Commonly known as: DRAMAMINE Take 50 mg by mouth as needed.   ergocalciferol 1.25 MG (50000 UT) capsule Commonly known as: VITAMIN D2 Take 1 capsule (50,000 Units total) by mouth 2 (two) times a week.   ibuprofen 200 MG tablet Commonly known as: ADVIL Take 400 mg by mouth every 6 (six) hours as needed for pain.       Allergies:  Allergies  Allergen Reactions  . Azithromycin Itching and Rash  . Darvon [Propoxyphene Hcl] Hives  . Iohexol Nausea And Vomiting     Desc: VOM[facility]ING-10 YRS AGO [facility]  Marland Kitchen Propoxyphene Hives and Itching  . Feraheme [Ferumoxytol] Other (See Comments)    Severe back spasms  . Morphine And Related Other (See Comments)  . Oseltamivir Rash    Past Medical History, Surgical history, Social history, and Family History were reviewed and updated.  Review of Systems: All other 10 point review of systems is negative.   Physical Exam:  vitals were not taken for this visit.   Wt Readings from Last 3 Encounters:  02/28/20 Marland Kitchen)  91 lb 1.9 oz (41.3 kg)  08/31/19 91 lb 1.9 oz (41.3 kg)  01/24/19 100 lb 12.8 oz (45.7 kg)    Ocular: Sclerae unicteric, pupils equal, round and reactive to light Ear-nose-throat: Oropharynx clear, dentition fair Lymphatic: No cervical or supraclavicular adenopathy Lungs no rales or rhonchi, good excursion bilaterally Heart regular rate and rhythm, no murmur appreciated Abd soft, nontender, positive bowel sounds MSK no focal spinal tenderness, no joint edema Neuro: non-focal, well-oriented, appropriate affect Breasts: Deferred   Lab Results  Component Value Date   WBC 9.2 09/18/2020   HGB 13.0 09/18/2020   HCT  41.7 09/18/2020   MCV 98.3 09/18/2020   PLT 251 09/18/2020   Lab Results  Component Value Date   FERRITIN 2,336 (H) 02/28/2020   IRON 44 02/28/2020   TIBC 263 02/28/2020   UIBC 219 02/28/2020   IRONPCTSAT 17 (L) 02/28/2020   Lab Results  Component Value Date   RETICCTPCT 1.4 09/18/2020   RBC 4.24 09/18/2020   RBC 4.22 09/18/2020   RETICCTABS 49.8 12/25/2011   No results found for: KPAFRELGTCHN, LAMBDASER, KAPLAMBRATIO No results found for: IGGSERUM, IGA, IGMSERUM No results found for: Odetta Pink, SPEI   Chemistry      Component Value Date/Time   NA 137 02/28/2020 1306   NA 139 06/14/2017 1022   K 3.9 02/28/2020 1306   K 3.9 06/14/2017 1022   CL 99 02/28/2020 1306   CL 105 09/07/2016 1151   CO2 29 02/28/2020 1306   CO2 26 06/14/2017 1022   BUN 16 02/28/2020 1306   BUN 6.8 (L) 06/14/2017 1022   CREATININE 0.85 02/28/2020 1306   CREATININE 0.8 06/14/2017 1022      Component Value Date/Time   CALCIUM 10.1 02/28/2020 1306   CALCIUM 9.6 06/14/2017 1022   ALKPHOS 114 02/28/2020 1306   ALKPHOS 91 06/14/2017 1022   AST 15 02/28/2020 1306   AST 15 06/14/2017 1022   ALT 14 02/28/2020 1306   ALT 14 06/14/2017 1022   BILITOT 0.3 02/28/2020 1306   BILITOT 0.40 06/14/2017 1022       Impression and Plan: Ms. 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 We will see what her iron studies look like and replace if needed.  B12 and vitamin D pending. Supplements and flexeril refilled.  Follow-up in 6 months.  She can contact our office with any questions or concerns.   [name], NP 2/16/20223:22 PM

## 2020-09-19 ENCOUNTER — Telehealth: Payer: Self-pay

## 2020-09-19 ENCOUNTER — Other Ambulatory Visit: Payer: Self-pay | Admitting: Family

## 2020-09-19 LAB — IRON AND TIBC
Iron: 42 ug/dL (ref 41–142)
Saturation Ratios: 17 % — ABNORMAL LOW (ref 21–57)
TIBC: 250 ug/dL (ref 236–444)
UIBC: 208 ug/dL (ref 120–384)

## 2020-09-19 LAB — FERRITIN: Ferritin: 2274 ng/mL — ABNORMAL HIGH (ref 11–307)

## 2020-09-19 MED ORDER — ERGOCALCIFEROL 1.25 MG (50000 UT) PO CAPS: 50000.0000 [IU] | ORAL_CAPSULE | ORAL | 8 refills | Status: DC

## 2020-09-19 MED ORDER — CYCLOBENZAPRINE HCL 5 MG PO TABS: 5.0000 mg | ORAL_TABLET | Freq: Three times a day (TID) | ORAL | 0 refills | Status: AC | PRN

## 2020-09-19 MED ORDER — CYANOCOBALAMIN 1000 MCG/ML IJ SOLN: 1000.0000 ug | INTRAMUSCULAR | 1 refills | Status: DC

## 2020-09-19 NOTE — Telephone Encounter (Signed)
No 09/18/20 los entered    [employer]

## 2020-09-20 ENCOUNTER — Telehealth: Payer: Self-pay

## 2020-09-20 NOTE — Telephone Encounter (Signed)
S/w pt and she is aware of her iron appt and f/u in aug per 09/19/20 sch message   Denise Lawson

## 2020-09-23 ENCOUNTER — Other Ambulatory Visit: Payer: Self-pay

## 2020-09-23 ENCOUNTER — Inpatient Hospital Stay: Payer: Medicare Other

## 2020-09-23 VITALS — BP 121/63 | HR 74 | Temp 97.8°F | Resp 17

## 2020-09-23 DIAGNOSIS — D508 Other iron deficiency anemias: Secondary | ICD-10-CM | POA: Diagnosis not present

## 2020-09-23 DIAGNOSIS — K909 Intestinal malabsorption, unspecified: Secondary | ICD-10-CM

## 2020-09-23 DIAGNOSIS — D509 Iron deficiency anemia, unspecified: Secondary | ICD-10-CM

## 2020-09-23 MED ORDER — SODIUM CHLORIDE 0.9 % IV SOLN
125.0000 mg | Freq: Once | INTRAVENOUS | Status: AC
  Administered 2020-09-23: 125 mg via INTRAVENOUS
  Filled 2020-09-23: qty 10

## 2020-09-23 MED ORDER — SODIUM CHLORIDE 0.9 % IV SOLN
Freq: Once | INTRAVENOUS | Status: AC
  Filled 2020-09-23: qty 250

## 2020-09-23 NOTE — Patient Instructions (Signed)

## 2021-02-07 ENCOUNTER — Other Ambulatory Visit: Payer: Self-pay | Admitting: Family

## 2021-02-07 DIAGNOSIS — E559 Vitamin D deficiency, unspecified: Secondary | ICD-10-CM

## 2021-03-19 ENCOUNTER — Inpatient Hospital Stay: Payer: Medicare Other | Admitting: Family

## 2021-03-19 ENCOUNTER — Inpatient Hospital Stay: Payer: Medicare Other

## 2021-07-04 ENCOUNTER — Ambulatory Visit: Payer: Medicare Other | Admitting: Family

## 2021-07-04 ENCOUNTER — Other Ambulatory Visit: Payer: Medicare Other

## 2021-07-15 ENCOUNTER — Inpatient Hospital Stay: Payer: Medicare Other | Admitting: Family

## 2021-07-15 ENCOUNTER — Encounter: Payer: Self-pay | Admitting: Family

## 2021-07-15 ENCOUNTER — Inpatient Hospital Stay: Payer: Medicare Other | Attending: Family

## 2021-07-15 ENCOUNTER — Other Ambulatory Visit: Payer: Self-pay

## 2021-07-15 ENCOUNTER — Inpatient Hospital Stay: Payer: Medicare Other

## 2021-07-15 VITALS — BP 114/63 | HR 68 | Temp 98.2°F | Resp 16

## 2021-07-15 VITALS — BP 101/53 | HR 74 | Temp 98.3°F | Resp 17 | Wt 95.3 lb

## 2021-07-15 DIAGNOSIS — D510 Vitamin B12 deficiency anemia due to intrinsic factor deficiency: Secondary | ICD-10-CM | POA: Diagnosis not present

## 2021-07-15 DIAGNOSIS — R0602 Shortness of breath: Secondary | ICD-10-CM | POA: Diagnosis not present

## 2021-07-15 DIAGNOSIS — K5090 Crohn's disease, unspecified, without complications: Secondary | ICD-10-CM | POA: Insufficient documentation

## 2021-07-15 DIAGNOSIS — R002 Palpitations: Secondary | ICD-10-CM | POA: Diagnosis not present

## 2021-07-15 DIAGNOSIS — R5383 Other fatigue: Secondary | ICD-10-CM | POA: Insufficient documentation

## 2021-07-15 DIAGNOSIS — E559 Vitamin D deficiency, unspecified: Secondary | ICD-10-CM

## 2021-07-15 DIAGNOSIS — K909 Intestinal malabsorption, unspecified: Secondary | ICD-10-CM | POA: Diagnosis not present

## 2021-07-15 DIAGNOSIS — D509 Iron deficiency anemia, unspecified: Secondary | ICD-10-CM

## 2021-07-15 LAB — CMP (CANCER CENTER ONLY)
ALT: 10 U/L (ref 0–44)
AST: 12 U/L — ABNORMAL LOW (ref 15–41)
Albumin: 3.7 g/dL (ref 3.5–5.0)
Alkaline Phosphatase: 122 U/L (ref 38–126)
Anion gap: 8 (ref 5–15)
BUN: 14 mg/dL (ref 8–23)
CO2: 28 mmol/L (ref 22–32)
Calcium: 9.6 mg/dL (ref 8.9–10.3)
Chloride: 102 mmol/L (ref 98–111)
Creatinine: 0.73 mg/dL (ref 0.44–1.00)
GFR, Estimated: >60 mL/min (ref >60)
Glucose, Bld: 105 mg/dL — ABNORMAL HIGH (ref 70–99)
Potassium: 3.5 mmol/L (ref 3.5–5.1)
Sodium: 138 mmol/L (ref 135–145)
Total Bilirubin: 0.2 mg/dL — ABNORMAL LOW (ref 0.3–1.2)
Total Protein: 7 g/dL (ref 6.5–8.1)

## 2021-07-15 LAB — CBC WITH DIFFERENTIAL (CANCER CENTER ONLY)
Abs Immature Granulocytes: 0.06 10*3/uL (ref 0.00–0.07)
Basophils Absolute: 0.1 10*3/uL (ref 0.0–0.1)
Basophils Relative: 1 %
Eosinophils Absolute: 0.2 10*3/uL (ref 0.0–0.5)
Eosinophils Relative: 2 %
HCT: 39.4 % (ref 36.0–46.0)
Hemoglobin: 12.1 g/dL (ref 12.0–15.0)
Immature Granulocytes: 1 %
Lymphocytes Relative: 24 %
Lymphs Abs: 2.3 10*3/uL (ref 0.7–4.0)
MCH: 30.2 pg (ref 26.0–34.0)
MCHC: 30.7 g/dL (ref 30.0–36.0)
MCV: 98.3 fL (ref 80.0–100.0)
Monocytes Absolute: 0.7 10*3/uL (ref 0.1–1.0)
Monocytes Relative: 7 %
Neutro Abs: 6.4 10*3/uL (ref 1.7–7.7)
Neutrophils Relative %: 65 %
Platelet Count: 332 10*3/uL (ref 150–400)
RBC: 4.01 MIL/uL (ref 3.87–5.11)
RDW: 14.1 % (ref 11.5–15.5)
WBC Count: 9.7 10*3/uL (ref 4.0–10.5)
nRBC: 0 % (ref 0.0–0.2)

## 2021-07-15 LAB — RETICULOCYTES
Immature Retic Fract: 8.6 % (ref 2.3–15.9)
RBC.: 3.96 MIL/uL (ref 3.87–5.11)
Retic Count, Absolute: 78.4 10*3/uL (ref 19.0–186.0)
Retic Ct Pct: 2 % (ref 0.4–3.1)

## 2021-07-15 LAB — VITAMIN D 25 HYDROXY (VIT D DEFICIENCY, FRACTURES): Vit D, 25-Hydroxy: 20.87 ng/mL — ABNORMAL LOW (ref 30–100)

## 2021-07-15 LAB — VITAMIN B12: Vitamin B-12: 254 pg/mL (ref 180–914)

## 2021-07-15 MED ORDER — SODIUM CHLORIDE 0.9 % IV SOLN: Freq: Once | INTRAVENOUS | Status: AC

## 2021-07-15 MED ORDER — SODIUM CHLORIDE 0.9 % IV SOLN
125.0000 mg | Freq: Once | INTRAVENOUS | Status: AC
  Administered 2021-07-15: 125 mg via INTRAVENOUS
  Filled 2021-07-15: qty 125

## 2021-07-15 NOTE — Patient Instructions (Signed)
Iron Deficiency Anemia, Adult Iron deficiency anemia is when you do not have enough red blood cells or hemoglobin in your blood. This happens because you have too little iron in your body. Hemoglobin carries oxygen to parts of the body. Anemia can cause yourbody to not get enough oxygen. What are the causes? Not eating enough foods that have iron in them. The body not being able to take in iron well. Needing more iron due to pregnancy or heavy menstrual periods, for females. Cancer. Bleeding in the bowels. Many blood draws. What increases the risk? Being pregnant. Being a teenage girl going through a growth spurt. What are the signs or symptoms? Pale skin, lips, and nails. Weakness, dizziness, and getting tired easily. Headache. Feeling like you cannot breathe well when moving (shortness of breath). Cold hands and feet. Fast heartbeat or a heartbeat that is not regular. Feeling grouchy (irritable) or breathing fast. These are more common in very bad anemia. Mild anemia may not cause any symptoms. How is this treated? This condition is treated by finding out why you do not have enough iron and then getting more iron. It may include: Adding foods to your diet that have a lot of iron. Taking iron pills (supplements). If you are pregnant or breastfeeding, you may need to take extra iron. Your diet often does not provide the amount of iron that you need. Getting more vitamin C in your diet. Vitamin C helps your body take in iron. You may need to take iron pills with a glass of orange juice or vitamin C pills. Medicines to make heavy menstrual periods lighter. Surgery. You may need blood tests to see if treatment is working. If the treatment doesnot seem to be working, you may need more tests. Follow these instructions at home: Medicines Take over-the-counter and prescription medicines only as told by your doctor. This includes iron pills and vitamins. Take iron pills when your stomach is  empty. If you cannot handle this, take them with food. Do not drink milk or take antacids at the same time as your iron pills. Iron pills may turn your poop (stool)black. If you cannot handle taking iron pills by mouth, ask your doctor about getting iron through: An IV tube. A shot (injection) into a muscle. Eating and drinking  Talk with your doctor before changing the foods you eat. He or she may tell you to eat foods that have a lot of iron, such as: Liver. Low-fat (lean) beef. Breads and cereals that have iron added to them. Eggs. Dried fruit. Dark green, leafy vegetables. Eat fresh fruits and vegetables that are high in vitamin C. They help your body use iron. Foods with a lot of vitamin C include: Oranges. Peppers. Tomatoes. Mangoes. Drink enough fluid to keep your pee (urine) pale yellow.  Managing constipation If you are taking iron pills, they may cause trouble pooping (constipation). To prevent or treat trouble pooping, you may need to: Take over-the-counter or prescription medicines. Eat foods that are high in fiber. These include beans, whole grains, and fresh fruits and vegetables. Limit foods that are high in fat and sugar. These include fried or sweet foods. General instructions Return to your normal activities as told by your doctor. Ask your doctor what activities are safe for you. Keep yourself clean, and keep things clean around you. Keep all follow-up visits as told by your doctor. This is important. Contact a doctor if: You feel like you may vomit (nauseous), or you vomit. You feel   weak. You are sweating for no reason. You have trouble pooping, such as: Pooping less than 3 times a week. Straining to poop. Having poop that is hard, dry, or larger than normal. Feeling full or bloated. Pain in the lower belly. Not feeling better after pooping. Get help right away if: You pass out (faint). You have chest pain. You have trouble breathing that: Is very  bad. Gets worse with physical activity. You have a fast heartbeat, or a heartbeat that does not feel regular. You get light-headed when getting up from sitting or lying down. These symptoms may be an emergency. Do not wait to see if the symptoms will go away. Get medical help right away. Call your local emergency services (911 in the [place] Do not drive yourself to the hospital. Summary Iron deficiency anemia is when you have too little iron in your body. This condition is treated by finding out why you do not have enough iron in your body and then getting more iron. Take over-the-counter and prescription medicines only as told by your doctor. Eat fresh fruits and vegetables that are high in vitamin C. Get help right away if you cannot breathe well. This information is not intended to replace advice given to you by your health care provider. Make sure you discuss any questions you have with your healthcare provider. Document Revised: 03/28/2019 Document Reviewed: 03/28/2019 Elsevier Patient Education  2022 [employer]  

## 2021-07-15 NOTE — Progress Notes (Signed)
Hematology and Oncology Follow Up Visit  Denise Lawson [id number] 1955/08/05 65 y.o. 07/15/2021   Principle Diagnosis:  Recurrent iron deficiency anemia Pernicious anemia Crohn's disease - exacerbation   Current Therapy:        IV iron as indicated  B12 1,000 mcg IM monthly - self    Interim History:  Denise Lawson is here today for follow-up. She is symptomatic with fatigue, palpitations and SOB with exertion.  Iron saturation recently with PCP was 10% and ferritin 1980.  She has bright red blood at time with BM and straining. She has cyclic flares Crohn's disease.  No other blood loss noted. No bruising or petechiae.  No fever, chills, n/v, cough, rash, dizziness, chest pain, abdominal pain or changes in bowel or bladder habits.  No numbness or tingling in her extremities.  No falls or syncope to report.  She has an ok appetite and is doing her best to stay well hydrated throughout the day. Her weight is stable at 95 lbs.   ECOG Performance Status: 1 - Symptomatic but completely ambulatory  Medications:  Allergies as of 07/15/2021       Reactions   Azithromycin Itching, Rash   Darvon [propoxyphene Hcl] Hives   Iohexol Nausea And Vomiting    Desc: VOMITING-10 YRS AGO [facility]   Propoxyphene Hives, Itching   Feraheme [ferumoxytol] Other (See Comments)   Severe back spasms   Morphine And Related Other (See Comments)   Oseltamivir Rash        Medication List        Accurate as of July 15, 2021  2:35 PM. If you have any questions, ask your nurse or doctor.          CALCIUM 1200+D3 PO Take by mouth.   cyanocobalamin 1000 MCG/ML injection Commonly known as: (VITAMIN B-12) Inject 1 mL (1,000 mcg total) into the muscle every 30 (thirty) days. Please include syringes for administration. Thank you!   cyclobenzaprine 5 MG tablet Commonly known as: FLEXERIL Take 1 tablet (5 mg total) by mouth 3 (three) times daily as needed for muscle spasms.    DEPADE PO Take 4.5 mg by mouth.   dimenhyDRINATE 50 MG tablet Commonly known as: DRAMAMINE Take 50 mg by mouth as needed.   ibuprofen 200 MG tablet Commonly known as: ADVIL Take 400 mg by mouth every 6 (six) hours as needed for pain.   Vitamin D (Ergocalciferol) 1.25 MG (50000 UNIT) Caps capsule Commonly known as: DRISDOL TAKE 1 CAPSULE (50,000 UNITS TOTAL) BY MOUTH 2 (TWO) TIMES A WEEK.        Allergies:  Allergies  Allergen Reactions   Azithromycin Itching and Rash   Darvon [Propoxyphene Hcl] Hives   Iohexol Nausea And Vomiting     Desc: VOMITING-10 YRS AGO [facility]   Propoxyphene Hives and Itching   Feraheme [Ferumoxytol] Other (See Comments)    Severe back spasms   Morphine And Related Other (See Comments)   Oseltamivir Rash    Past Medical History, Surgical history, Social history, and Family History were reviewed and updated.  Review of Systems: All other 10 point review of systems is negative.   Physical Exam:  weight is 95 lb 4.8 oz (43.2 kg). Her oral temperature is 98.3 F (36.8 C). Her blood pressure is 101/53 (abnormal) and her pulse is 74. Her respiration is 17 and oxygen saturation is 100%.   Wt Readings from Last 3 Encounters:  07/15/21 95 lb 4.8 oz (43.2  kg)  09/18/20 94 lb 6.4 oz (42.8 kg)  02/28/20 (!) 91 lb 1.9 oz (41.3 kg)    Ocular: Sclerae unicteric, pupils equal, round and reactive to light Ear-nose-throat: Oropharynx clear, dentition fair Lymphatic: No cervical or supraclavicular adenopathy Lungs no rales or rhonchi, good excursion bilaterally Heart regular rate and rhythm, no murmur appreciated Abd soft, nontender, positive bowel sounds MSK no focal spinal tenderness, no joint edema Neuro: non-focal, well-oriented, appropriate affect Breasts: Deferred   Lab Results  Component Value Date   WBC 9.7 07/15/2021   HGB 12.1 07/15/2021   HCT 39.4 07/15/2021   MCV 98.3 07/15/2021   PLT 332 07/15/2021   Lab Results   Component Value Date   FERRITIN 2,274 (H) 09/18/2020   IRON 42 09/18/2020   TIBC 250 09/18/2020   UIBC 208 09/18/2020   IRONPCTSAT 17 (L) 09/18/2020   Lab Results  Component Value Date   RETICCTPCT 2.0 07/15/2021   RBC 3.96 07/15/2021   RETICCTABS 49.8 12/25/2011   No results found for: KPAFRELGTCHN, LAMBDASER, KAPLAMBRATIO No results found for: IGGSERUM, IGA, IGMSERUM No results found for: Denise Lawson, SPEI   Chemistry      Component Value Date/Time   NA 138 07/15/2021 1339   NA 139 06/14/2017 1022   K 3.5 07/15/2021 1339   K 3.9 06/14/2017 1022   CL 102 07/15/2021 1339   CL 105 09/07/2016 1151   CO2 28 07/15/2021 1339   CO2 26 06/14/2017 1022   BUN 14 07/15/2021 1339   BUN 6.8 (L) 06/14/2017 1022   CREATININE 0.73 07/15/2021 1339   CREATININE 0.8 06/14/2017 1022      Component Value Date/Time   CALCIUM 9.6 07/15/2021 1339   CALCIUM 9.6 06/14/2017 1022   ALKPHOS 122 07/15/2021 1339   ALKPHOS 91 06/14/2017 1022   AST 12 (L) 07/15/2021 1339   AST 15 06/14/2017 1022   ALT 10 07/15/2021 1339   ALT 14 06/14/2017 1022   BILITOT 0.2 (L) 07/15/2021 1339   BILITOT 0.40 06/14/2017 1022       Impression and Plan:  Denise Lawson is a very pleasant 65 yo caucasian female with 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  She received IV iron today and will get a second dose next week.  B 12 and Vit D pending.  Follow-up in 6 months.   Denise Dawson, NP 12/13/20222:35 PM

## 2021-07-16 LAB — IRON AND TIBC
Iron: 37 ug/dL — ABNORMAL LOW (ref 41–142)
Saturation Ratios: 13 % — ABNORMAL LOW (ref 21–57)
TIBC: 280 ug/dL (ref 236–444)
UIBC: 243 ug/dL (ref 120–384)

## 2021-07-16 LAB — FERRITIN: Ferritin: 2038 ng/mL — ABNORMAL HIGH (ref 11–307)

## 2021-07-17 ENCOUNTER — Telehealth: Payer: Self-pay | Admitting: *Deleted

## 2021-07-17 NOTE — Telephone Encounter (Signed)
Per 07/15/21 los - called and gave upcoming appointments - confirmed °

## 2021-07-24 ENCOUNTER — Other Ambulatory Visit: Payer: Self-pay

## 2021-07-24 ENCOUNTER — Inpatient Hospital Stay: Payer: Medicare Other

## 2021-07-24 VITALS — BP 119/62 | HR 78 | Temp 98.0°F | Resp 17

## 2021-07-24 DIAGNOSIS — K909 Intestinal malabsorption, unspecified: Secondary | ICD-10-CM

## 2021-07-24 DIAGNOSIS — D509 Iron deficiency anemia, unspecified: Secondary | ICD-10-CM | POA: Diagnosis not present

## 2021-07-24 MED ORDER — SODIUM CHLORIDE 0.9 % IV SOLN: Freq: Once | INTRAVENOUS | Status: AC

## 2021-07-24 MED ORDER — SODIUM CHLORIDE 0.9 % IV SOLN
125.0000 mg | Freq: Once | INTRAVENOUS | Status: AC
  Administered 2021-07-24: 12:00:00 125 mg via INTRAVENOUS
  Filled 2021-07-24: qty 125

## 2021-07-24 NOTE — Progress Notes (Signed)
Pt declined to stay for post infusion observation period. Pt stated she has tolerated medication multiple times prior without difficulty. Pt aware to call clinic with any questions or concerns. Pt verbalized understanding and had no further questions.  ? ?

## 2021-07-24 NOTE — Patient Instructions (Signed)
Ferric Carboxymaltose Injection °What is this medication? °FERRIC CARBOXYMALTOSE (FER ik kar BOX ee MAWL tose) treats low levels of iron in your body (iron deficiency anemia). Iron is a mineral that plays an important role in making red blood cells, which carry oxygen from your lungs to the rest of your body. °This medicine may be used for other purposes; ask your health care provider or pharmacist if you have questions. °COMMON BRAND NAME(S): Injectafer °What should I tell my care team before I take this medication? °They need to know if you have any of these conditions: °High blood pressure °High levels of iron in the blood °An unusual or allergic reaction to iron, other medications, foods, dyes, or preservatives °Pregnant or trying to get pregnant °Breast-feeding °How should I use this medication? °This medication is injected into a vein. It is given by your care team in a hospital or clinic setting. °Talk to your care team about the use of this medication in children. While it may be given to children as young as 1 year for selected conditions, precautions do apply. °Overdosage: If you think you have taken too much of this medicine contact a poison control center or emergency room at once. °NOTE: This medicine is only for you. Do not share this medicine with others. °What if I miss a dose? °Keep appointments for follow-up doses. It is important not to miss your dose. Call your care team if you are unable to keep an appointment. °What may interact with this medication? °Do not take this medication with any of the following medications: °Deferoxamine °Dimercaprol °Other iron products °This list may not describe all possible interactions. Give your health care provider a list of all the medicines, herbs, non-prescription drugs, or dietary supplements you use. Also tell them if you smoke, drink alcohol, or use illegal drugs. Some items may interact with your medicine. °What should I watch for while using this  medication? °Visit your care team for regular checks on your progress. Tell your care team if your symptoms do not start to get better or if they get worse. °You may need blood work while you are taking this medication. °You may need to eat more foods that contain iron. Talk to your care team. Foods that contain iron include whole grains/cereals, dried fruits, beans, or peas, leafy green vegetables, and organ meats (liver, kidney). °What side effects may I notice from receiving this medication? °Side effects that you should report to your care team as soon as possible: °Allergic reactions--skin rash, itching, hives, swelling of the face, lips, tongue, or throat °Increase in blood pressure °Low blood pressure--dizziness, feeling faint or lightheaded, blurry vision °Shortness of breath °Side effects that usually do not require medical attention (report to your care team if they continue or are bothersome): °Flushing °Headache °Joint pain °Muscle pain °Nausea °Pain, redness, or irritation at injection site °This list may not describe all possible side effects. Call your doctor for medical advice about side effects. You may report side effects to FDA at 1-800-FDA-1088. °Where should I keep my medication? °This medication is given in a hospital or clinic. It will not be stored at home. °NOTE: This sheet is a summary. It may not cover all possible information. If you have questions about this medicine, talk to your doctor, pharmacist, or health care provider. °© 2022 [employer] (2020-12-13 00:00:00) ° °

## 2021-08-21 ENCOUNTER — Other Ambulatory Visit: Payer: Self-pay | Admitting: Gastroenterology

## 2021-08-21 DIAGNOSIS — K50919 Crohn's disease, unspecified, with unspecified complications: Secondary | ICD-10-CM

## 2021-08-29 ENCOUNTER — Ambulatory Visit
Admission: RE | Admit: 2021-08-29 | Discharge: 2021-08-29 | Disposition: A | Discharge status: Home / Self Care | Payer: Medicare Other | Source: Ambulatory Visit | Attending: Gastroenterology | Admitting: Gastroenterology

## 2021-08-29 DIAGNOSIS — K50919 Crohn's disease, unspecified, with unspecified complications: Secondary | ICD-10-CM

## 2021-08-29 MED ORDER — IOPAMIDOL (ISOVUE-300) INJECTION 61%
100.0000 mL | Freq: Once | INTRAVENOUS | Status: AC | PRN
  Administered 2021-08-29: 100 mL via INTRAVENOUS

## 2022-01-13 ENCOUNTER — Other Ambulatory Visit: Payer: Self-pay | Admitting: Oncology

## 2022-01-13 ENCOUNTER — Encounter: Payer: Self-pay | Admitting: Family

## 2022-01-13 ENCOUNTER — Inpatient Hospital Stay: Payer: Medicare Other | Attending: Hematology & Oncology

## 2022-01-13 ENCOUNTER — Inpatient Hospital Stay: Payer: Medicare Other | Admitting: Family

## 2022-01-13 VITALS — BP 128/62 | HR 73 | Temp 98.3°F | Resp 18 | Wt 96.8 lb

## 2022-01-13 DIAGNOSIS — D510 Vitamin B12 deficiency anemia due to intrinsic factor deficiency: Secondary | ICD-10-CM

## 2022-01-13 DIAGNOSIS — D509 Iron deficiency anemia, unspecified: Secondary | ICD-10-CM

## 2022-01-13 DIAGNOSIS — K50911 Crohn's disease, unspecified, with rectal bleeding: Secondary | ICD-10-CM | POA: Insufficient documentation

## 2022-01-13 DIAGNOSIS — E559 Vitamin D deficiency, unspecified: Secondary | ICD-10-CM

## 2022-01-13 DIAGNOSIS — K909 Intestinal malabsorption, unspecified: Secondary | ICD-10-CM

## 2022-01-13 LAB — CBC WITH DIFFERENTIAL (CANCER CENTER ONLY)
Abs Immature Granulocytes: 0.18 10*3/uL — ABNORMAL HIGH (ref 0.00–0.07)
Basophils Absolute: 0.1 10*3/uL (ref 0.0–0.1)
Basophils Relative: 1 %
Eosinophils Absolute: 0.3 10*3/uL (ref 0.0–0.5)
Eosinophils Relative: 3 %
HCT: 40.5 % (ref 36.0–46.0)
Hemoglobin: 12.6 g/dL (ref 12.0–15.0)
Immature Granulocytes: 2 %
Lymphocytes Relative: 22 %
Lymphs Abs: 2.4 10*3/uL (ref 0.7–4.0)
MCH: 31.1 pg (ref 26.0–34.0)
MCHC: 31.1 g/dL (ref 30.0–36.0)
MCV: 100 fL (ref 80.0–100.0)
Monocytes Absolute: 0.7 10*3/uL (ref 0.1–1.0)
Monocytes Relative: 6 %
Neutro Abs: 7.2 10*3/uL (ref 1.7–7.7)
Neutrophils Relative %: 66 %
Platelet Count: 259 10*3/uL (ref 150–400)
RBC: 4.05 MIL/uL (ref 3.87–5.11)
RDW: 13.8 % (ref 11.5–15.5)
WBC Count: 10.8 10*3/uL — ABNORMAL HIGH (ref 4.0–10.5)
nRBC: 0 % (ref 0.0–0.2)

## 2022-01-13 LAB — RETICULOCYTES
Immature Retic Fract: 6.1 % (ref 2.3–15.9)
RBC.: 4.07 MIL/uL (ref 3.87–5.11)
Retic Count, Absolute: 65.1 10*3/uL (ref 19.0–186.0)
Retic Ct Pct: 1.6 % (ref 0.4–3.1)

## 2022-01-13 LAB — CMP (CANCER CENTER ONLY)
ALT: 23 U/L (ref 0–44)
AST: 21 U/L (ref 15–41)
Albumin: 3.5 g/dL (ref 3.5–5.0)
Alkaline Phosphatase: 111 U/L (ref 38–126)
Anion gap: 10 (ref 5–15)
BUN: 14 mg/dL (ref 8–23)
CO2: 25 mmol/L (ref 22–32)
Calcium: 9.3 mg/dL (ref 8.9–10.3)
Chloride: 104 mmol/L (ref 98–111)
Creatinine: 0.87 mg/dL (ref 0.44–1.00)
GFR, Estimated: >60 mL/min (ref >60)
Glucose, Bld: 88 mg/dL (ref 70–99)
Potassium: 3.6 mmol/L (ref 3.5–5.1)
Sodium: 139 mmol/L (ref 135–145)
Total Bilirubin: 0.2 mg/dL — ABNORMAL LOW (ref 0.3–1.2)
Total Protein: 7.5 g/dL (ref 6.5–8.1)

## 2022-01-13 LAB — FERRITIN: Ferritin: 1155 ng/mL — ABNORMAL HIGH (ref 11–307)

## 2022-01-13 LAB — VITAMIN D 25 HYDROXY (VIT D DEFICIENCY, FRACTURES): Vit D, 25-Hydroxy: 35.48 ng/mL (ref 30–100)

## 2022-01-13 LAB — VITAMIN B12: Vitamin B-12: 204 pg/mL (ref 180–914)

## 2022-01-13 NOTE — Progress Notes (Unsigned)
Hematology and Oncology Follow Up Visit  Denise Lawson [id number] 1956-02-03 66 y.o. 01/13/2022   Principle Diagnosis:  Recurrent iron deficiency anemia Pernicious anemia Crohn's disease - exacerbation   Current Therapy:        IV iron as indicated  B12 1,000 mcg IM monthly - self  Vitamin D 50,000 units PO twice a week   Interim History:  Denise Lawson is here today for follow-up. She is having issues with hemorrhoid pain in the rectum and has started doing acupuncture. She does feel that this seems to be helping. She has had 5 sessions so far.  She does note blood in her stool at times. She has an appointment coming up with GI. She states that she is just not ready to move forward with a colectomy.  No other blood loss noted. No bruising or petechiae.  No fever, chills, n/v, cough, rash, dizziness, SOB, chest pain, palpitations or changes in bladder habits.  No tenderness, numbness or tingling in her extremities at this time.  She states that she has occasional puffiness in her feet and ankles.  No falls or syncope reported.  Appetite is ok. She is doing her best to stay well hydrated. Her weight is stable at 96 lbs.   ECOG Performance Status: 1 - Symptomatic but completely ambulatory  Medications:  Allergies as of 01/13/2022       Reactions   Azithromycin Itching, Rash   Darvon [propoxyphene Hcl] Hives   Iohexol Nausea And Vomiting    Desc: VOMITING-10 YRS [facility]GO [facility]   Propoxyphene Hives, Itching   Feraheme [ferumoxytol] Other (See Comments)   Severe back spasms   Morphine And Related Other (See Comments)   Oseltamivir Rash        Medication List        Accurate as of January 13, 2022  2:27 PM. If you have any questions, ask your nurse or doctor.          CALCIUM 1200+D3 PO Take by mouth.   cyanocobalamin 1000 MCG/ML injection Commonly known as: (VITAMIN B-12) Inject 1 mL (1,000 mcg total) into the muscle every 30 (thirty) days. Please include  syringes for administration. Thank you!   cyclobenzaprine 5 MG tablet Commonly known as: FLEXERIL Take 1 tablet (5 mg total) by mouth 3 (three) times daily as needed for muscle spasms.   DEPADE PO Take 4.5 mg by mouth.   dimenhyDRINATE 50 MG tablet Commonly known as: DRAMAMINE Take 50 mg by mouth as needed.   ibuprofen 200 MG tablet Commonly known as: ADVIL Take 400 mg by mouth every 6 (six) hours as needed for pain.   Vitamin D (Ergocalciferol) 1.25 MG (50000 UNIT) Caps capsule Commonly known as: DRISDOL TAKE 1 CAPSULE (50,000 UNITS TOTAL) BY MOUTH 2 (TWO) TIMES A WEEK.        Allergies:  Allergies  Allergen Reactions   Azithromycin Itching and Rash   Darvon [Propoxyphene Hcl] Hives   Iohexol Nausea And Vomiting     Desc: VOMITING-10 YRS AGO [facility]   Propoxyphene Hives and Itching   Feraheme [Ferumoxytol] Other (See Comments)    Severe back spasms   Morphine And Related Other (See Comments)   Oseltamivir Rash    Past Medical History, Surgical history, Social history, and Family History were reviewed and updated.  Review of Systems: All other 10 point review of systems is negative.   Physical Exam:  vitals were not taken for this visit.   Wt  Readings from Last 3 Encounters:  07/15/21 95 lb 4.8 oz (43.2 kg)  09/18/20 94 lb 6.4 oz (42.8 kg)  02/28/20 (!) 91 lb 1.9 oz (41.3 kg)    Ocular: Sclerae unicteric, pupils equal, round and reactive to light Ear-nose-throat: Oropharynx clear, dentition fair Lymphatic: No cervical or supraclavicular adenopathy Lungs no rales or rhonchi, good excursion bilaterally Heart regular rate and rhythm, no murmur appreciated Abd soft, nontender, positive bowel sounds MSK no focal spinal tenderness, no joint edema Neuro: non-focal, well-oriented, appropriate affect Breasts: Deferred   Lab Results  Component Value Date   WBC 10.8 (H) 01/13/2022   HGB 12.6 01/13/2022   HCT 40.5 01/13/2022   MCV 100.0 01/13/2022    PLT 259 01/13/2022   Lab Results  Component Value Date   FERRITIN 2,038 (H) 07/15/2021   IRON 37 (L) 07/15/2021   TIBC 280 07/15/2021   UIBC 243 07/15/2021   IRONPCTSAT 13 (L) 07/15/2021   Lab Results  Component Value Date   RETICCTPCT 1.6 01/13/2022   RBC 4.07 01/13/2022   RETICCTABS 49.8 12/25/2011   No results found for: "KPAFRELGTCHN", "LAMBDASER", "KAPLAMBRATIO" No results found for: "IGGSERUM", "IGA", "IGMSERUM" No results found for: "TOTALPROTELP", "ALBUMINELP", "A1GS", "A2GS", "BETS", "BETA2SER", "GAMS", "MSPIKE", "SPEI"   Chemistry      Component Value Date/Time   NA 138 07/15/2021 1339   NA 139 06/14/2017 1022   K 3.5 07/15/2021 1339   K 3.9 06/14/2017 1022   CL 102 07/15/2021 1339   CL 105 09/07/2016 1151   CO2 28 07/15/2021 1339   CO2 26 06/14/2017 1022   BUN 14 07/15/2021 1339   BUN 6.8 (L) 06/14/2017 1022   CREATININE 0.73 07/15/2021 1339   CREATININE 0.8 06/14/2017 1022      Component Value Date/Time   CALCIUM 9.6 07/15/2021 1339   CALCIUM 9.6 06/14/2017 1022   ALKPHOS 122 07/15/2021 1339   ALKPHOS 91 06/14/2017 1022   AST 12 (L) 07/15/2021 1339   AST 15 06/14/2017 1022   ALT 10 07/15/2021 1339   ALT 14 06/14/2017 1022   BILITOT 0.2 (L) 07/15/2021 1339   BILITOT 0.40 06/14/2017 1022       Impression and Plan: Denise Lawson is a very pleasant 66 yo caucasian female with 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  Iron studies, B 12 and vitamin D level are pending.  Follow-up in 6 months.   Denise Dawson, NP 6/13/20232:27 PM

## 2022-01-14 ENCOUNTER — Encounter: Payer: Self-pay | Admitting: Family

## 2022-01-14 LAB — IRON AND IRON BINDING CAPACITY (CC-WL,HP ONLY)
Iron: 61 ug/dL (ref 28–170)
Saturation Ratios: 21 % (ref 10.4–31.8)
TIBC: 293 ug/dL (ref 250–450)
UIBC: 232 ug/dL (ref 148–442)

## 2022-01-16 ENCOUNTER — Telehealth: Payer: Self-pay | Admitting: *Deleted

## 2022-01-16 NOTE — Telephone Encounter (Signed)
Per 01/13/22 los - called and gave upcoming appointments - confirmed

## 2022-07-15 ENCOUNTER — Ambulatory Visit: Payer: Medicare Other | Admitting: Family

## 2022-07-15 ENCOUNTER — Other Ambulatory Visit: Payer: Medicare Other

## 2022-09-16 ENCOUNTER — Other Ambulatory Visit: Payer: Self-pay

## 2022-09-16 ENCOUNTER — Encounter: Payer: Self-pay | Admitting: Family

## 2022-09-16 ENCOUNTER — Inpatient Hospital Stay: Payer: Medicare Other

## 2022-09-16 ENCOUNTER — Inpatient Hospital Stay: Payer: Medicare Other | Attending: Family | Admitting: Family

## 2022-09-16 VITALS — BP 123/52 | HR 88 | Temp 98.7°F | Resp 18 | Ht 61.02 in | Wt 94.0 lb

## 2022-09-16 DIAGNOSIS — K909 Intestinal malabsorption, unspecified: Secondary | ICD-10-CM | POA: Diagnosis not present

## 2022-09-16 DIAGNOSIS — E559 Vitamin D deficiency, unspecified: Secondary | ICD-10-CM | POA: Diagnosis not present

## 2022-09-16 DIAGNOSIS — D509 Iron deficiency anemia, unspecified: Secondary | ICD-10-CM

## 2022-09-16 DIAGNOSIS — K50911 Crohn's disease, unspecified, with rectal bleeding: Secondary | ICD-10-CM | POA: Diagnosis not present

## 2022-09-16 DIAGNOSIS — D510 Vitamin B12 deficiency anemia due to intrinsic factor deficiency: Secondary | ICD-10-CM

## 2022-09-16 DIAGNOSIS — K50113 Crohn's disease of large intestine with fistula: Secondary | ICD-10-CM

## 2022-09-16 LAB — CMP (CANCER CENTER ONLY)
ALT: 8 U/L (ref 0–44)
AST: 9 U/L — ABNORMAL LOW (ref 15–41)
Albumin: 3.6 g/dL (ref 3.5–5.0)
Alkaline Phosphatase: 123 U/L (ref 38–126)
Anion gap: 10 (ref 5–15)
BUN: 13 mg/dL (ref 8–23)
CO2: 28 mmol/L (ref 22–32)
Calcium: 9.2 mg/dL (ref 8.9–10.3)
Chloride: 100 mmol/L (ref 98–111)
Creatinine: 0.77 mg/dL (ref 0.44–1.00)
GFR, Estimated: >60 mL/min (ref >60)
Glucose, Bld: 128 mg/dL — ABNORMAL HIGH (ref 70–99)
Potassium: 3.8 mmol/L (ref 3.5–5.1)
Sodium: 138 mmol/L (ref 135–145)
Total Bilirubin: 0.3 mg/dL (ref 0.3–1.2)
Total Protein: 7.5 g/dL (ref 6.5–8.1)

## 2022-09-16 LAB — CBC WITH DIFFERENTIAL (CANCER CENTER ONLY)
Abs Immature Granulocytes: 0.34 10*3/uL — ABNORMAL HIGH (ref 0.00–0.07)
Basophils Absolute: 0 10*3/uL (ref 0.0–0.1)
Basophils Relative: 0 %
Eosinophils Absolute: 0.2 10*3/uL (ref 0.0–0.5)
Eosinophils Relative: 1 %
HCT: 39.5 % (ref 36.0–46.0)
Hemoglobin: 12.1 g/dL (ref 12.0–15.0)
Immature Granulocytes: 2 %
Lymphocytes Relative: 12 %
Lymphs Abs: 1.9 10*3/uL (ref 0.7–4.0)
MCH: 29.4 pg (ref 26.0–34.0)
MCHC: 30.6 g/dL (ref 30.0–36.0)
MCV: 95.9 fL (ref 80.0–100.0)
Monocytes Absolute: 0.6 10*3/uL (ref 0.1–1.0)
Monocytes Relative: 4 %
Neutro Abs: 12.6 10*3/uL — ABNORMAL HIGH (ref 1.7–7.7)
Neutrophils Relative %: 81 %
Platelet Count: 361 10*3/uL (ref 150–400)
RBC: 4.12 MIL/uL (ref 3.87–5.11)
RDW: 13.8 % (ref 11.5–15.5)
WBC Count: 15.7 10*3/uL — ABNORMAL HIGH (ref 4.0–10.5)
nRBC: 0 % (ref 0.0–0.2)

## 2022-09-16 LAB — RETICULOCYTES
Immature Retic Fract: 11.4 % (ref 2.3–15.9)
RBC.: 4.09 MIL/uL (ref 3.87–5.11)
Retic Count, Absolute: 61.4 10*3/uL (ref 19.0–186.0)
Retic Ct Pct: 1.5 % (ref 0.4–3.1)

## 2022-09-16 LAB — FERRITIN: Ferritin: 1337 ng/mL — ABNORMAL HIGH (ref 11–307)

## 2022-09-16 LAB — VITAMIN B12: Vitamin B-12: 167 pg/mL — ABNORMAL LOW (ref 180–914)

## 2022-09-16 NOTE — Progress Notes (Signed)
Hematology and Oncology Follow Up Visit  Denise Lawson ID:[id number] Sep 17, 1955 67 y.o. 09/16/2022   Principle Diagnosis:  Recurrent iron deficiency anemia Pernicious anemia Crohn's disease - exacerbation   Current Therapy:        IV iron as indicated  B12 1,000 mcg IM monthly - self  Vitamin D 50,000 units PO twice a week   Interim History:  Ms. Denise Lawson is here today with her husband for follow-up. She is getting over a head cold and congestion. She has not had a fever in 3 days and her energy is slowly improving. Cough with green phlegm and mild SOB with exertion. Lung sounds are clear throughout.  She has colonic spasms that with radiate up into her chest as well.  She notes fatigue.  She notes bright red blood in her stool at times. No other blood loss noted.  No bruising or petechiae.  No chills, n/v, rash, dizziness, chest pain, palpitations or changes in bowel or bladder habits.  No swelling, tenderness, numbness or tingling in her extremities at this time.  No falls or syncope.  She states her appetite and hydration are good. Weight is 94 lbs.   ECOG Performance Status: 1 - Symptomatic but completely ambulatory  Medications:  Allergies as of 09/16/2022       Reactions   Azithromycin Itching, Rash   Darvon [propoxyphene Hcl] Hives   Iohexol Nausea And Vomiting    Desc: VOM[facility]ING-10 YRS [facility]GO [facility]   Propoxyphene Hives, Itching   Feraheme [ferumoxytol] Other (See Comments)   Severe back spasms   Morphine And Related Other (See Comments)   Oseltamivir Rash        Medication List        Accurate as of September 16, 2022  3:14 PM. If you have any questions, ask your nurse or doctor.          STOP taking these medications    CALCIUM 1200+D3 PO Stopped by: Lottie Dawson, NP   cyanocobalamin 1000 MCG/ML injection Commonly known as: VITAMIN B12 Stopped by: Lottie Dawson, NP   DEPADE PO Stopped by: Lottie Dawson, NP   Vitamin D (Ergocalciferol)  1.25 MG (50000 UNIT) Caps capsule Commonly known as: DRISDOL Stopped by: Lottie Dawson, NP       TAKE these medications    cyclobenzaprine 5 MG tablet Commonly known as: FLEXERIL Take 1 tablet (5 mg total) by mouth 3 (three) times daily as needed for muscle spasms.   dimenhyDRINATE 50 MG tablet Commonly known as: DRAMAMINE Take 50 mg by mouth as needed.   ibuprofen 200 MG tablet Commonly known as: ADVIL Take 400 mg by mouth every 6 (six) hours as needed for pain.        Allergies:  Allergies  Allergen Reactions   Azithromycin Itching and Rash   Darvon [Propoxyphene Hcl] Hives   Iohexol Nausea And Vomiting     Desc: VOM[facility]ING-10 YRS [facility]GO [facility]   Propoxyphene Hives and Itching   Feraheme [Ferumoxytol] Other (See Comments)    Severe back spasms   Morphine And Related Other (See Comments)   Oseltamivir Rash    Past Medical History, Surgical history, Social history, and Family History were reviewed and updated.  Review of Systems: All other 10 point review of systems is negative.   Physical Exam:  height is 5' 1.02" (1.55 m) and weight is 94 lb (42.6 kg). Her oral temperature is 98.7 F (37.1 C). Her blood pressure is 123/52 (abnormal) and  her pulse is 88. Her respiration is 18 and oxygen saturation is 99%.   Wt Readings from Last 3 Encounters:  09/16/22 94 lb (42.6 kg)  01/13/22 96 lb 12.8 oz (43.9 kg)  07/15/21 95 lb 4.8 oz (43.2 kg)    Ocular: Sclerae unicteric, pupils equal, round and reactive to light Ear-nose-throat: Oropharynx clear, dentition fair Lymphatic: No cervical or supraclavicular adenopathy Lungs no rales or rhonchi, good excursion bilaterally Heart regular rate and rhythm, no murmur appreciated Abd soft, nontender, positive bowel sounds MSK no focal spinal tenderness, no joint edema Neuro: non-focal, well-oriented, appropriate affect Breasts: Deferred   Lab Results  Component Value Date   WBC 15.7 (H) 09/16/2022   HGB 12.1  09/16/2022   HCT 39.5 09/16/2022   MCV 95.9 09/16/2022   PLT 361 09/16/2022   Lab Results  Component Value Date   FERRITIN 1,155 (H) 01/13/2022   IRON 61 01/13/2022   TIBC 293 01/13/2022   UIBC 232 01/13/2022   IRONPCTSAT 21 01/13/2022   Lab Results  Component Value Date   RETICCTPCT 1.5 09/16/2022   RBC 4.09 09/16/2022   RETICCTABS 49.8 12/25/2011   No results found for: "KPAFRELGTCHN", "LAMBDASER", "KAPLAMBRATIO" No results found for: "IGGSERUM", "IGA", "IGMSERUM" No results found for: "TOTALPROTELP", "ALBUMINELP", "A1GS", "A2GS", "BETS", "BETA2SER", "GAMS", "MSPIKE", "SPEI"   Chemistry      Component Value Date/Time   NA 138 09/16/2022 1401   NA 139 06/14/2017 1022   K 3.8 09/16/2022 1401   K 3.9 06/14/2017 1022   CL 100 09/16/2022 1401   CL 105 09/07/2016 1151   CO2 28 09/16/2022 1401   CO2 26 06/14/2017 1022   BUN 13 09/16/2022 1401   BUN 6.8 (L) 06/14/2017 1022   CREATININE 0.77 09/16/2022 1401   CREATININE 0.8 06/14/2017 1022      Component Value Date/Time   CALCIUM 9.2 09/16/2022 1401   CALCIUM 9.6 06/14/2017 1022   ALKPHOS 123 09/16/2022 1401   ALKPHOS 91 06/14/2017 1022   AST 9 (L) 09/16/2022 1401   AST 15 06/14/2017 1022   ALT 8 09/16/2022 1401   ALT 14 06/14/2017 1022   BILITOT 0.3 09/16/2022 1401   BILITOT 0.40 06/14/2017 1022       Impression and Plan: 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  Iron studies, B 12 and vitamin D level are pending.  Follow-up in 6 months.  Lottie Dawson, NP 2/14/20243:14 PM

## 2022-09-17 LAB — MISC LABCORP TEST (SEND OUT): Labcorp test code: 81950

## 2022-09-17 LAB — IRON AND IRON BINDING CAPACITY (CC-WL,HP ONLY)
Iron: 26 ug/dL — ABNORMAL LOW (ref 28–170)
Saturation Ratios: 11 % (ref 10.4–31.8)
TIBC: 238 ug/dL — ABNORMAL LOW (ref 250–450)
UIBC: 212 ug/dL (ref 148–442)

## 2022-09-18 ENCOUNTER — Other Ambulatory Visit: Payer: Medicare Other

## 2022-09-18 ENCOUNTER — Ambulatory Visit: Payer: Medicare Other | Admitting: Family

## 2022-09-21 ENCOUNTER — Telehealth: Payer: Self-pay | Admitting: *Deleted

## 2022-09-21 ENCOUNTER — Other Ambulatory Visit: Payer: Self-pay | Admitting: Family

## 2022-09-21 ENCOUNTER — Other Ambulatory Visit: Payer: Self-pay

## 2022-09-21 ENCOUNTER — Other Ambulatory Visit: Payer: Self-pay | Admitting: *Deleted

## 2022-09-21 DIAGNOSIS — K50113 Crohn's disease of large intestine with fistula: Secondary | ICD-10-CM

## 2022-09-21 DIAGNOSIS — E559 Vitamin D deficiency, unspecified: Secondary | ICD-10-CM

## 2022-09-21 DIAGNOSIS — D510 Vitamin B12 deficiency anemia due to intrinsic factor deficiency: Secondary | ICD-10-CM

## 2022-09-21 DIAGNOSIS — K909 Intestinal malabsorption, unspecified: Secondary | ICD-10-CM

## 2022-09-21 MED ORDER — ERGOCALCIFEROL 1.25 MG (50000 UT) PO CAPS
50000.0000 [IU] | ORAL_CAPSULE | ORAL | 0 refills | Status: AC
Start: 2022-09-21 — End: ?

## 2022-09-21 MED ORDER — CYANOCOBALAMIN 1000 MCG/ML IJ SOLN: 1000.0000 ug | INTRAMUSCULAR | 3 refills | Status: DC

## 2022-09-21 NOTE — Telephone Encounter (Signed)
-----   Message from Celso Amy, NP sent at 09/21/2022 10:01 AM EST ----- IS she taking her vit B and D supplements? Both are still low.   ----- Message ----- From: Buel Ream, [facility] [place] Sent: 09/17/2022   4:36 AM EST To: Celso Amy, NP

## 2022-09-21 NOTE — Telephone Encounter (Signed)
Unable to reach pt via Telephone. Email sent to pt.

## 2022-10-07 ENCOUNTER — Telehealth: Payer: Self-pay

## 2022-10-07 NOTE — Telephone Encounter (Signed)
Received phone call from patient stating that she had been told she didn't need an iron infusion but then received a phone call at a later date stating she did. Pt stated she wanted to clarify and schedule if possible. This RN discussed with Lottie Dawson NP and patient does need 2 doses of Ferrlicit. Pt aware and stated due to insurance costs she only wanted one dose of iron. Pt transferred to scheduling to make appointments. Pt verbalized understanding and had no further questions.

## 2022-10-09 ENCOUNTER — Inpatient Hospital Stay: Payer: Medicare Other | Attending: Family

## 2022-10-09 ENCOUNTER — Other Ambulatory Visit: Payer: Self-pay | Admitting: Oncology

## 2022-10-09 VITALS — BP 102/47 | HR 70 | Temp 97.9°F | Resp 17

## 2022-10-09 DIAGNOSIS — Z79899 Other long term (current) drug therapy: Secondary | ICD-10-CM | POA: Diagnosis not present

## 2022-10-09 DIAGNOSIS — D510 Vitamin B12 deficiency anemia due to intrinsic factor deficiency: Secondary | ICD-10-CM | POA: Insufficient documentation

## 2022-10-09 DIAGNOSIS — K909 Intestinal malabsorption, unspecified: Secondary | ICD-10-CM

## 2022-10-09 DIAGNOSIS — D509 Iron deficiency anemia, unspecified: Secondary | ICD-10-CM

## 2022-10-09 MED ORDER — SODIUM CHLORIDE 0.9 % IV SOLN: Freq: Once | INTRAVENOUS | Status: AC

## 2022-10-09 MED ORDER — CYANOCOBALAMIN 1000 MCG/ML IJ SOLN
1000.0000 ug | Freq: Once | INTRAMUSCULAR | Status: AC
  Administered 2022-10-09: 1000 ug via SUBCUTANEOUS
  Filled 2022-10-09: qty 1

## 2022-10-09 MED ORDER — SODIUM CHLORIDE 0.9 % IV SOLN
125.0000 mg | Freq: Once | INTRAVENOUS | Status: AC
  Administered 2022-10-09: 125 mg via INTRAVENOUS
  Filled 2022-10-09: qty 125

## 2022-10-09 NOTE — Patient Instructions (Signed)
Sodium Ferric Gluconate Complex Injection What is this medication? SODIUM FERRIC GLUCONATE COMPLEX (SOE dee um FER ik GLOO koe nate KOM pleks) treats low levels of iron (iron deficiency anemia) in people with kidney disease. Iron is a mineral that plays an important role in making red blood cells, which carry oxygen from your lungs to the rest of your body. This medicine may be used for other purposes; ask your health care provider or pharmacist if you have questions. COMMON BRAND NAME(S): Ferrlecit, Nulecit What should I tell my care team before I take this medication? They need to know if you have any of the following conditions: Anemia that is not from iron deficiency High levels of iron in the blood An unusual or allergic reaction to iron, other medications, foods, dyes, or preservatives Pregnant or are trying to become pregnant Breast-feeding How should I use this medication? This medication is injected into a vein. It is given by your care team in a hospital or clinic setting. Talk to your care team about the use of this medication in children. While it may be prescribed for children as young as 6 years for selected conditions, precautions do apply. Overdosage: If you think you have taken too much of this medicine contact a poison control center or emergency room at once. NOTE: This medicine is only for you. Do not share this medicine with others. What if I miss a dose? It is important not to miss your dose. Call your care team if you are unable to keep an appointment. What may interact with this medication? Do not take this medication with any of the following: Deferasirox Deferoxamine Dimercaprol This medication may also interact with the following: Other iron products This list may not describe all possible interactions. Give your health care provider a list of all the medicines, herbs, non-prescription drugs, or dietary supplements you use. Also tell them if you smoke, drink  alcohol, or use illegal drugs. Some items may interact with your medicine. What should I watch for while using this medication? Your condition will be monitored carefully while you are receiving this medication. Visit your care team for regular checks on your progress. You may need blood work while you are taking this medication. What side effects may I notice from receiving this medication? Side effects that you should report to your care team as soon as possible: Allergic reactions--skin rash, itching, hives, swelling of the face, lips, tongue, or throat Low blood pressure--dizziness, feeling faint or lightheaded, blurry vision Shortness of breath Side effects that usually do not require medical attention (report to your care team if they continue or are bothersome): Flushing Headache Joint pain Muscle pain Nausea Pain, redness, or irritation at injection site This list may not describe all possible side effects. Call your doctor for medical advice about side effects. You may report side effects to FDA at 1-800-FDA-1088. Where should I keep my medication? This medication is given in a hospital or clinic and will not be stored at home. NOTE: This sheet is a summary. It may not cover all possible information. If you have questions about this medicine, talk to your doctor, pharmacist, or health care provider.  2023 Elsevier/Gold Standard (2020-12-13 00:00:00)  

## 2023-02-02 ENCOUNTER — Inpatient Hospital Stay: Payer: Medicare Other | Attending: Hematology & Oncology

## 2023-02-02 ENCOUNTER — Inpatient Hospital Stay (HOSPITAL_BASED_OUTPATIENT_CLINIC_OR_DEPARTMENT_OTHER): Payer: Medicare Other | Admitting: Family

## 2023-02-02 ENCOUNTER — Encounter: Payer: Self-pay | Admitting: Family

## 2023-02-02 VITALS — BP 117/46 | HR 87 | Temp 98.6°F | Resp 17 | Wt 92.0 lb

## 2023-02-02 DIAGNOSIS — D508 Other iron deficiency anemias: Secondary | ICD-10-CM | POA: Insufficient documentation

## 2023-02-02 DIAGNOSIS — E559 Vitamin D deficiency, unspecified: Secondary | ICD-10-CM

## 2023-02-02 DIAGNOSIS — K50113 Crohn's disease of large intestine with fistula: Secondary | ICD-10-CM

## 2023-02-02 DIAGNOSIS — D509 Iron deficiency anemia, unspecified: Secondary | ICD-10-CM | POA: Diagnosis not present

## 2023-02-02 DIAGNOSIS — K909 Intestinal malabsorption, unspecified: Secondary | ICD-10-CM

## 2023-02-02 DIAGNOSIS — K5090 Crohn's disease, unspecified, without complications: Secondary | ICD-10-CM | POA: Insufficient documentation

## 2023-02-02 DIAGNOSIS — D510 Vitamin B12 deficiency anemia due to intrinsic factor deficiency: Secondary | ICD-10-CM

## 2023-02-02 LAB — IRON AND IRON BINDING CAPACITY (CC-WL,HP ONLY)
Iron: 54 ug/dL (ref 28–170)
Saturation Ratios: 20 % (ref 10.4–31.8)
TIBC: 273 ug/dL (ref 250–450)
UIBC: 219 ug/dL (ref 148–442)

## 2023-02-02 LAB — CBC WITH DIFFERENTIAL (CANCER CENTER ONLY)
Abs Immature Granulocytes: 0.07 10*3/uL (ref 0.00–0.07)
Basophils Absolute: 0.1 10*3/uL (ref 0.0–0.1)
Basophils Relative: 0 %
Eosinophils Absolute: 0.1 10*3/uL (ref 0.0–0.5)
Eosinophils Relative: 1 %
HCT: 39.2 % (ref 36.0–46.0)
Hemoglobin: 11.9 g/dL — ABNORMAL LOW (ref 12.0–15.0)
Immature Granulocytes: 1 %
Lymphocytes Relative: 16 %
Lymphs Abs: 1.8 10*3/uL (ref 0.7–4.0)
MCH: 29.5 pg (ref 26.0–34.0)
MCHC: 30.4 g/dL (ref 30.0–36.0)
MCV: 97.3 fL (ref 80.0–100.0)
Monocytes Absolute: 0.4 10*3/uL (ref 0.1–1.0)
Monocytes Relative: 3 %
Neutro Abs: 9 10*3/uL — ABNORMAL HIGH (ref 1.7–7.7)
Neutrophils Relative %: 79 %
Platelet Count: 366 10*3/uL (ref 150–400)
RBC: 4.03 MIL/uL (ref 3.87–5.11)
RDW: 13.5 % (ref 11.5–15.5)
WBC Count: 11.5 10*3/uL — ABNORMAL HIGH (ref 4.0–10.5)
nRBC: 0 % (ref 0.0–0.2)

## 2023-02-02 LAB — CMP (CANCER CENTER ONLY)
ALT: 11 U/L (ref 0–44)
AST: 10 U/L — ABNORMAL LOW (ref 15–41)
Albumin: 3.6 g/dL (ref 3.5–5.0)
Alkaline Phosphatase: 118 U/L (ref 38–126)
Anion gap: 10 (ref 5–15)
BUN: 13 mg/dL (ref 8–23)
CO2: 28 mmol/L (ref 22–32)
Calcium: 9.8 mg/dL (ref 8.9–10.3)
Chloride: 103 mmol/L (ref 98–111)
Creatinine: 0.8 mg/dL (ref 0.44–1.00)
GFR, Estimated: >60 mL/min (ref >60)
Glucose, Bld: 115 mg/dL — ABNORMAL HIGH (ref 70–99)
Potassium: 4 mmol/L (ref 3.5–5.1)
Sodium: 141 mmol/L (ref 135–145)
Total Bilirubin: 0.3 mg/dL (ref 0.3–1.2)
Total Protein: 7.6 g/dL (ref 6.5–8.1)

## 2023-02-02 LAB — VITAMIN D 25 HYDROXY (VIT D DEFICIENCY, FRACTURES): Vit D, 25-Hydroxy: 39.59 ng/mL (ref 30–100)

## 2023-02-02 LAB — RETICULOCYTES
Immature Retic Fract: 12.2 % (ref 2.3–15.9)
RBC.: 4.04 MIL/uL (ref 3.87–5.11)
Retic Count, Absolute: 61 10*3/uL (ref 19.0–186.0)
Retic Ct Pct: 1.5 % (ref 0.4–3.1)

## 2023-02-02 LAB — FERRITIN: Ferritin: 1171 ng/mL — ABNORMAL HIGH (ref 11–307)

## 2023-02-02 LAB — VITAMIN B12: Vitamin B-12: 440 pg/mL (ref 180–914)

## 2023-02-02 NOTE — Progress Notes (Signed)
Hematology and Oncology Follow Up Visit  Denise Lawson [id number] 1956-06-18 67 y.o. 02/02/2023   Principle Diagnosis:  Recurrent iron deficiency anemia Pernicious anemia Crohn's disease - exacerbation   Current Therapy:        IV iron as indicated  B12 1,000 mcg IM monthly - self  Vitamin D 50,000 units PO twice a week   Interim History:  Denise Lawson is here today for follow-up. She is getting over a recent UTI and completed her oral antibiotic.  Hgb is 11.9, MCV 97, WBC count 11.5 and platelets 366.  She has the occasional Crohn's flare with blood in her stool. No other blood loss noted.  No bruising, no petechiae.  She notes fatigue and occasional palpitations.  No fever, chills, n/v, cough, rash, dizziness, SOB or chest pain.  No swelling, tenderness, numbness or tingling in her extremities at this time.  No falls or syncope.  Appetite comes and goes. She is doing her best to stay well hydrated. Her weight is 92 lbs.   ECOG Performance Status: 1 - Symptomatic but completely ambulatory  Medications:  Allergies as of 02/02/2023       Reactions   Azithromycin Itching, Rash   Darvon [propoxyphene Hcl] Hives   Iohexol Nausea And Vomiting    Desc: VOMITING-10 YRS AGO [facility]   Propoxyphene Hives, Itching   Feraheme [ferumoxytol] Other (See Comments)   Severe back spasms   Morphine And Codeine Other (See Comments)   Oseltamivir Rash        Medication List        Accurate as of February 02, 2023  1:07 PM. If you have any questions, ask your nurse or doctor.          cyanocobalamin 1000 MCG/ML injection Commonly known as: VITAMIN B12 Inject 1 mL (1,000 mcg total) into the muscle every 30 (thirty) days. Please include syringes for administration. Thank you!   cyclobenzaprine 5 MG tablet Commonly known as: FLEXERIL Take 1 tablet (5 mg total) by mouth 3 (three) times daily as needed for muscle spasms.   dimenhyDRINATE 50 MG tablet Commonly known as:  DRAMAMINE Take 50 mg by mouth as needed.   ergocalciferol 1.25 MG (50000 UT) capsule Commonly known as: VITAMIN D2 Take 1 capsule (50,000 Units total) by mouth once a week.   ibuprofen 200 MG tablet Commonly known as: ADVIL Take 400 mg by mouth every 6 (six) hours as needed for pain.        Allergies:  Allergies  Allergen Reactions   Azithromycin Itching and Rash   Darvon [Propoxyphene Hcl] Hives   Iohexol Nausea And Vomiting     Desc: VOMITING-10 YRS AGO @ [facility]   Propoxyphene Hives and Itching   Feraheme [Ferumoxytol] Other (See Comments)    Severe back spasms   Morphine And Codeine Other (See Comments)   Oseltamivir Rash    Past Medical History, Surgical history, Social history, and Family History were reviewed and updated.  Review of Systems: All other 10 point review of systems is negative.   Physical Exam:  vitals were not taken for this visit.   Wt Readings from Last 3 Encounters:  09/16/22 94 lb (42.6 kg)  01/13/22 96 lb 12.8 oz (43.9 kg)  07/15/21 95 lb 4.8 oz (43.2 kg)    Ocular: Sclerae unicteric, pupils equal, round and reactive to light Ear-nose-throat: Oropharynx clear, dentition fair Lymphatic: No cervical or supraclavicular adenopathy Lungs no rales or rhonchi, good excursion bilaterally Heart  regular rate and rhythm, no murmur appreciated Abd soft, nontender, positive bowel sounds MSK no focal spinal tenderness, no joint edema Neuro: non-focal, well-oriented, appropriate affect Breasts: Deferred   Lab Results  Component Value Date   WBC 15.7 (H) 09/16/2022   HGB 12.1 09/16/2022   HCT 39.5 09/16/2022   MCV 95.9 09/16/2022   PLT 361 09/16/2022   Lab Results  Component Value Date   FERRITIN 1,337 (H) 09/16/2022   IRON 26 (L) 09/16/2022   TIBC 238 (L) 09/16/2022   UIBC 212 09/16/2022   IRONPCTSAT 11 09/16/2022   Lab Results  Component Value Date   RETICCTPCT 1.5 09/16/2022   RBC 4.09 09/16/2022   RETICCTABS 49.8  12/25/2011   No results found for: "KPAFRELGTCHN", "LAMBDASER", "KAPLAMBRATIO" No results found for: "IGGSERUM", "IGA", "IGMSERUM" No results found for: "TOTALPROTELP", "ALBUMINELP", "A1GS", "A2GS", "BETS", "BETA2SER", "GAMS", "MSPIKE", "SPEI"   Chemistry      Component Value Date/Time   NA 138 09/16/2022 1401   NA 139 06/14/2017 1022   K 3.8 09/16/2022 1401   K 3.9 06/14/2017 1022   CL 100 09/16/2022 1401   CL 105 09/07/2016 1151   CO2 28 09/16/2022 1401   CO2 26 06/14/2017 1022   BUN 13 09/16/2022 1401   BUN 6.8 (L) 06/14/2017 1022   CREATININE 0.77 09/16/2022 1401   CREATININE 0.8 06/14/2017 1022      Component Value Date/Time   CALCIUM 9.2 09/16/2022 1401   CALCIUM 9.6 06/14/2017 1022   ALKPHOS 123 09/16/2022 1401   ALKPHOS 91 06/14/2017 1022   AST 9 (L) 09/16/2022 1401   AST 15 06/14/2017 1022   ALT 8 09/16/2022 1401   ALT 14 06/14/2017 1022   BILITOT 0.3 09/16/2022 1401   BILITOT 0.40 06/14/2017 1022       Impression and Plan: Denise Lawson is a very pleasant 67 yo caucasian female with 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  Iron studies, B 12 and vitamin D level are pending.  Follow-up in 6 months.  Eileen Stanford, NP 7/2/20241:07 PM

## 2023-02-03 ENCOUNTER — Telehealth: Payer: Self-pay | Admitting: *Deleted

## 2023-02-03 NOTE — Telephone Encounter (Signed)
-----   Message from Erenest Blank, NP sent at 02/03/2023 10:12 AM EDT ----- Iron studies, B 12 and vit D are all much better! No intervention needed at this time.    ----- Message ----- From: Leory Plowman, [facility] [place] Sent: 02/02/2023   1:11 PM EDT To: Erenest Blank, NP

## 2023-03-17 ENCOUNTER — Other Ambulatory Visit: Payer: Medicare Other

## 2023-03-17 ENCOUNTER — Ambulatory Visit: Payer: Medicare Other | Admitting: Family

## 2023-08-09 ENCOUNTER — Ambulatory Visit: Payer: Medicare Other | Admitting: Family

## 2023-08-09 ENCOUNTER — Other Ambulatory Visit: Payer: Medicare Other

## 2023-08-18 ENCOUNTER — Inpatient Hospital Stay: Payer: Medicare Other | Admitting: Family

## 2023-08-18 ENCOUNTER — Inpatient Hospital Stay: Payer: Medicare Other | Attending: Hematology & Oncology

## 2023-08-18 VITALS — BP 104/57 | HR 70 | Temp 97.8°F | Resp 17 | Wt 90.0 lb

## 2023-08-18 DIAGNOSIS — D510 Vitamin B12 deficiency anemia due to intrinsic factor deficiency: Secondary | ICD-10-CM | POA: Diagnosis not present

## 2023-08-18 DIAGNOSIS — K5090 Crohn's disease, unspecified, without complications: Secondary | ICD-10-CM | POA: Insufficient documentation

## 2023-08-18 DIAGNOSIS — D508 Other iron deficiency anemias: Secondary | ICD-10-CM | POA: Insufficient documentation

## 2023-08-18 DIAGNOSIS — K50113 Crohn's disease of large intestine with fistula: Secondary | ICD-10-CM

## 2023-08-18 DIAGNOSIS — D509 Iron deficiency anemia, unspecified: Secondary | ICD-10-CM

## 2023-08-18 DIAGNOSIS — K909 Intestinal malabsorption, unspecified: Secondary | ICD-10-CM

## 2023-08-18 DIAGNOSIS — K921 Melena: Secondary | ICD-10-CM | POA: Insufficient documentation

## 2023-08-18 DIAGNOSIS — E559 Vitamin D deficiency, unspecified: Secondary | ICD-10-CM

## 2023-08-18 LAB — CMP (CANCER CENTER ONLY)
ALT: 15 U/L (ref 0–44)
AST: 15 U/L (ref 15–41)
Albumin: 3.6 g/dL (ref 3.5–5.0)
Alkaline Phosphatase: 123 U/L (ref 38–126)
Anion gap: 9 (ref 5–15)
BUN: 13 mg/dL (ref 8–23)
CO2: 28 mmol/L (ref 22–32)
Calcium: 9.3 mg/dL (ref 8.9–10.3)
Chloride: 101 mmol/L (ref 98–111)
Creatinine: 0.76 mg/dL (ref 0.44–1.00)
GFR, Estimated: >60 mL/min (ref >60)
Glucose, Bld: 80 mg/dL (ref 70–99)
Potassium: 4 mmol/L (ref 3.5–5.1)
Sodium: 138 mmol/L (ref 135–145)
Total Bilirubin: 0.2 mg/dL (ref 0.0–1.2)
Total Protein: 7.6 g/dL (ref 6.5–8.1)

## 2023-08-18 LAB — CBC WITH DIFFERENTIAL (CANCER CENTER ONLY)
Abs Immature Granulocytes: 0.05 10*3/uL (ref 0.00–0.07)
Basophils Absolute: 0 10*3/uL (ref 0.0–0.1)
Basophils Relative: 0 %
Eosinophils Absolute: 0.2 10*3/uL (ref 0.0–0.5)
Eosinophils Relative: 2 %
HCT: 39 % (ref 36.0–46.0)
Hemoglobin: 12.2 g/dL (ref 12.0–15.0)
Immature Granulocytes: 1 %
Lymphocytes Relative: 22 %
Lymphs Abs: 2.3 10*3/uL (ref 0.7–4.0)
MCH: 30.1 pg (ref 26.0–34.0)
MCHC: 31.3 g/dL (ref 30.0–36.0)
MCV: 96.3 fL (ref 80.0–100.0)
Monocytes Absolute: 0.7 10*3/uL (ref 0.1–1.0)
Monocytes Relative: 7 %
Neutro Abs: 7.4 10*3/uL (ref 1.7–7.7)
Neutrophils Relative %: 68 %
Platelet Count: 381 10*3/uL (ref 150–400)
RBC: 4.05 MIL/uL (ref 3.87–5.11)
RDW: 14.6 % (ref 11.5–15.5)
WBC Count: 10.7 10*3/uL — ABNORMAL HIGH (ref 4.0–10.5)
nRBC: 0 % (ref 0.0–0.2)

## 2023-08-18 LAB — RETICULOCYTES
Immature Retic Fract: 10.6 % (ref 2.3–15.9)
RBC.: 4 MIL/uL (ref 3.87–5.11)
Retic Count, Absolute: 76.4 10*3/uL (ref 19.0–186.0)
Retic Ct Pct: 1.9 % (ref 0.4–3.1)

## 2023-08-18 LAB — VITAMIN B12: Vitamin B-12: 238 pg/mL (ref 180–914)

## 2023-08-18 LAB — VITAMIN D 25 HYDROXY (VIT D DEFICIENCY, FRACTURES): Vit D, 25-Hydroxy: 19.51 ng/mL — ABNORMAL LOW (ref 30–100)

## 2023-08-18 LAB — IRON AND IRON BINDING CAPACITY (CC-WL,HP ONLY)
Iron: 40 ug/dL (ref 28–170)
Saturation Ratios: 13 % (ref 10.4–31.8)
TIBC: 316 ug/dL (ref 250–450)
UIBC: 276 ug/dL (ref 148–442)

## 2023-08-18 LAB — FERRITIN: Ferritin: 1259 ng/mL — ABNORMAL HIGH (ref 11–307)

## 2023-08-18 NOTE — Progress Notes (Signed)
 Hematology and Oncology Follow Up Visit  Denise Lawson [id number] [birth year] 68 y.o. 08/18/2023   Principle Diagnosis:  Recurrent iron deficiency anemia Pernicious anemia Crohn's disease - exacerbation   Current Therapy:        IV iron as indicated  B12 1,000 mcg IM monthly - self  Vitamin D  50,000 units PO twice a week   Interim History:  Denise Lawson is here today for follow-up. She is getting over a bout with RSV. She has also been having frequent flares with Crohn's. She has bright red blood in her stool. No other blood loss noted. No bruising or petechiae.  No fever, chills, n/v, cough, rash, dizziness, SOB, chest pain, palpitations or changes in bowel or bladder habits.  No swelling, tenderness, numbness or tingling in her extremities at this time.  No falls or syncope.  Appetite and hydration are good. Weight is 90 lbs.   ECOG Performance Status: 1 - Symptomatic but completely ambulatory  Medications:  Allergies as of 08/18/2023       Reactions   Azithromycin Itching, Rash   Darvon [propoxyphene Hcl] Hives   Iohexol Nausea And Vomiting    Desc: VOMITING-10 YRS AGO [facility]   Propoxyphene Hives, Itching   Feraheme  [ferumoxytol ] Other (See Comments)   Severe back spasms   Morphine And Codeine Other (See Comments)   Oseltamivir Rash        Medication List        Accurate as of August 18, 2023  2:20 PM. If you have any questions, ask your nurse or doctor.          STOP taking these medications    amoxicillin-clavulanate 875-125 MG tablet Commonly known as: AUGMENTIN Stopped by: Kennard Pea       TAKE these medications    cyanocobalamin  1000 MCG/ML injection Commonly known as: VITAMIN B12 Inject 1 mL (1,000 mcg total) into the muscle every 30 (thirty) days. Please include syringes for administration. Thank you!   cyclobenzaprine  5 MG tablet Commonly known as: FLEXERIL  Take 1 tablet (5 mg total) by mouth 3 (three) times daily as needed  for muscle spasms.   dimenhyDRINATE 50 MG tablet Commonly known as: DRAMAMINE Take 50 mg by mouth as needed.   ergocalciferol  1.25 MG (50000 UT) capsule Commonly known as: VITAMIN D2 Take 1 capsule (50,000 Units total) by mouth once a week.   ibuprofen  200 MG tablet Commonly known as: ADVIL  Take 400 mg by mouth every 6 (six) hours as needed for pain.   phenazopyridine 200 MG tablet Commonly known as: PYRIDIUM Take 200 mg by mouth 2 (two) times daily as needed.        Allergies:  Allergies  Allergen Reactions   Azithromycin Itching and Rash   Darvon [Propoxyphene Hcl] Hives   Iohexol Nausea And Vomiting     Desc: VOM[facility]ING-10 YRS [facility]GO [facility]   Propoxyphene Hives and Itching   Feraheme  [Ferumoxytol ] Other (See Comments)    Severe back spasms   Morphine And Codeine Other (See Comments)   Oseltamivir Rash    Past Medical History, Surgical history, Social history, and Family History were reviewed and updated.  Review of Systems: All other 10 point review of systems is negative.   Physical Exam:  weight is 90 lb (40.8 kg). Her oral temperature is 97.8 F (36.6 C). Her blood pressure is 104/57 (abnormal) and her pulse is 70. Her respiration is 17 and oxygen saturation is 100%.   Wt Readings from  Last 3 Encounters:  08/18/23 90 lb (40.8 kg)  02/02/23 92 lb (41.7 kg)  09/16/22 94 lb (42.6 kg)    Ocular: Sclerae unicteric, pupils equal, round and reactive to light Ear-nose-throat: Oropharynx clear, dentition fair Lymphatic: No cervical or supraclavicular adenopathy Lungs no rales or rhonchi, good excursion bilaterally Heart regular rate and rhythm, no murmur appreciated Abd soft, nontender, positive bowel sounds MSK no focal spinal tenderness, no joint edema Neuro: non-focal, well-oriented, appropriate affect Breasts: Deferred   Lab Results  Component Value Date   WBC 10.7 (H) 08/18/2023   HGB 12.2 08/18/2023   HCT 39.0 08/18/2023   MCV 96.3  08/18/2023   PLT 381 08/18/2023   Lab Results  Component Value Date   FERRITIN 1,171 (H) 02/02/2023   IRON 54 02/02/2023   TIBC 273 02/02/2023   UIBC 219 02/02/2023   IRONPCTSAT 20 02/02/2023   Lab Results  Component Value Date   RETICCTPCT 1.9 08/18/2023   RBC 4.00 08/18/2023   RETICCTABS 49.8 12/25/2011   No results found for: "KPAFRELGTCHN", "LAMBDASER", "KAPLAMBRATIO" No results found for: "IGGSERUM", "IGA", "IGMSERUM" No results found for: "TOTALPROTELP", "ALBUMINELP", "A1GS", "A2GS", "BETS", "BETA2SER", "GAMS", "MSPIKE", "SPEI"   Chemistry      Component Value Date/Time   NA 138 08/18/2023 1329   NA 139 06/14/2017 1022   K 4.0 08/18/2023 1329   K 3.9 06/14/2017 1022   CL 101 08/18/2023 1329   CL 105 09/07/2016 1151   CO2 28 08/18/2023 1329   CO2 26 06/14/2017 1022   BUN 13 08/18/2023 1329   BUN 6.8 (L) 06/14/2017 1022   CREATININE 0.76 08/18/2023 1329   CREATININE 0.8 06/14/2017 1022      Component Value Date/Time   CALCIUM 9.3 08/18/2023 1329   CALCIUM 9.6 06/14/2017 1022   ALKPHOS 123 08/18/2023 1329   ALKPHOS 91 06/14/2017 1022   AST 15 08/18/2023 1329   AST 15 06/14/2017 1022   ALT 15 08/18/2023 1329   ALT 14 06/14/2017 1022   BILITOT 0.2 08/18/2023 1329   BILITOT 0.40 06/14/2017 1022       Impression and Plan: Denise Lawson is a very pleasant 68 yo caucasian female with 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  Iron studies, B 12 and vitamin D  level are pending.  Follow-up in 6 months.   Kennard Pea, NP 1/15/20252:20 PM

## 2023-11-04 ENCOUNTER — Other Ambulatory Visit (HOSPITAL_COMMUNITY): Payer: Self-pay | Admitting: Gastroenterology

## 2023-11-04 DIAGNOSIS — K5010 Crohn's disease of large intestine without complications: Secondary | ICD-10-CM

## 2023-11-10 ENCOUNTER — Ambulatory Visit (HOSPITAL_COMMUNITY)
Admission: RE | Admit: 2023-11-10 | Discharge: 2023-11-10 | Disposition: A | Discharge status: Home / Self Care | Source: Ambulatory Visit | Attending: Gastroenterology | Admitting: Gastroenterology

## 2023-11-10 DIAGNOSIS — K5010 Crohn's disease of large intestine without complications: Secondary | ICD-10-CM | POA: Diagnosis present

## 2023-11-10 MED ORDER — GADOBUTROL 1 MMOL/ML IV SOLN
4.0000 mL | Freq: Once | INTRAVENOUS | Status: AC | PRN
  Administered 2023-11-10: 4 mL via INTRAVENOUS

## 2024-01-25 ENCOUNTER — Inpatient Hospital Stay: Attending: Family

## 2024-01-25 ENCOUNTER — Encounter: Payer: Self-pay | Admitting: Family

## 2024-01-25 ENCOUNTER — Other Ambulatory Visit: Payer: Self-pay

## 2024-01-25 ENCOUNTER — Inpatient Hospital Stay: Admitting: Family

## 2024-01-25 VITALS — BP 113/59 | HR 62 | Temp 97.8°F | Resp 17 | Wt 91.1 lb

## 2024-01-25 DIAGNOSIS — D508 Other iron deficiency anemias: Secondary | ICD-10-CM | POA: Diagnosis present

## 2024-01-25 DIAGNOSIS — D510 Vitamin B12 deficiency anemia due to intrinsic factor deficiency: Secondary | ICD-10-CM | POA: Insufficient documentation

## 2024-01-25 DIAGNOSIS — K50113 Crohn's disease of large intestine with fistula: Secondary | ICD-10-CM

## 2024-01-25 DIAGNOSIS — K909 Intestinal malabsorption, unspecified: Secondary | ICD-10-CM | POA: Insufficient documentation

## 2024-01-25 DIAGNOSIS — E559 Vitamin D deficiency, unspecified: Secondary | ICD-10-CM | POA: Diagnosis not present

## 2024-01-25 DIAGNOSIS — D509 Iron deficiency anemia, unspecified: Secondary | ICD-10-CM | POA: Diagnosis not present

## 2024-01-25 DIAGNOSIS — K5090 Crohn's disease, unspecified, without complications: Secondary | ICD-10-CM | POA: Diagnosis not present

## 2024-01-25 LAB — CBC WITH DIFFERENTIAL (CANCER CENTER ONLY)
Abs Immature Granulocytes: 0.04 10*3/uL (ref 0.00–0.07)
Basophils Absolute: 0.1 10*3/uL (ref 0.0–0.1)
Basophils Relative: 1 %
Eosinophils Absolute: 0.2 10*3/uL (ref 0.0–0.5)
Eosinophils Relative: 2 %
HCT: 36.6 % (ref 36.0–46.0)
Hemoglobin: 11.6 g/dL — ABNORMAL LOW (ref 12.0–15.0)
Immature Granulocytes: 0 %
Lymphocytes Relative: 19 %
Lymphs Abs: 1.9 10*3/uL (ref 0.7–4.0)
MCH: 29.6 pg (ref 26.0–34.0)
MCHC: 31.7 g/dL (ref 30.0–36.0)
MCV: 93.4 fL (ref 80.0–100.0)
Monocytes Absolute: 0.6 10*3/uL (ref 0.1–1.0)
Monocytes Relative: 6 %
Neutro Abs: 7.1 10*3/uL (ref 1.7–7.7)
Neutrophils Relative %: 72 %
Platelet Count: 346 10*3/uL (ref 150–400)
RBC: 3.92 MIL/uL (ref 3.87–5.11)
RDW: 13.5 % (ref 11.5–15.5)
WBC Count: 9.9 10*3/uL (ref 4.0–10.5)
nRBC: 0 % (ref 0.0–0.2)

## 2024-01-25 LAB — RETICULOCYTES
Immature Retic Fract: 9.4 % (ref 2.3–15.9)
RBC.: 3.96 MIL/uL (ref 3.87–5.11)
Retic Count, Absolute: 43.2 10*3/uL (ref 19.0–186.0)
Retic Ct Pct: 1.1 % (ref 0.4–3.1)

## 2024-01-25 LAB — CMP (CANCER CENTER ONLY)
ALT: 13 U/L (ref 0–44)
AST: 9 U/L — ABNORMAL LOW (ref 15–41)
Albumin: 3.6 g/dL (ref 3.5–5.0)
Alkaline Phosphatase: 112 U/L (ref 38–126)
Anion gap: 9 (ref 5–15)
BUN: 8 mg/dL (ref 8–23)
CO2: 24 mmol/L (ref 22–32)
Calcium: 9.2 mg/dL (ref 8.9–10.3)
Chloride: 105 mmol/L (ref 98–111)
Creatinine: 0.74 mg/dL (ref 0.44–1.00)
GFR, Estimated: >60 mL/min (ref >60)
Glucose, Bld: 103 mg/dL — ABNORMAL HIGH (ref 70–99)
Potassium: 3.8 mmol/L (ref 3.5–5.1)
Sodium: 138 mmol/L (ref 135–145)
Total Bilirubin: 0.4 mg/dL (ref 0.0–1.2)
Total Protein: 6.9 g/dL (ref 6.5–8.1)

## 2024-01-25 LAB — IRON AND IRON BINDING CAPACITY (CC-WL,HP ONLY)
Iron: 24 ug/dL — ABNORMAL LOW (ref 28–170)
Saturation Ratios: 9 % — ABNORMAL LOW (ref 10.4–31.8)
TIBC: 267 ug/dL (ref 250–450)
UIBC: 243 ug/dL (ref 148–442)

## 2024-01-25 LAB — VITAMIN B12: Vitamin B-12: 198 pg/mL (ref 180–914)

## 2024-01-25 LAB — VITAMIN D 25 HYDROXY (VIT D DEFICIENCY, FRACTURES): Vit D, 25-Hydroxy: 20.3 ng/mL — ABNORMAL LOW (ref 30–100)

## 2024-01-25 NOTE — Progress Notes (Signed)
 Hematology and Oncology Follow Up Visit  Denise Lawson [id number] 1955/10/18 68 y.o. 01/25/2024   Principle Diagnosis:  Recurrent iron deficiency anemia Pernicious anemia Crohn's disease - exacerbation   Current Therapy:        IV iron as indicated  B12 1,000 mcg IM monthly - self  Vitamin D  50,000 units PO twice a week   Interim History:  Denise Lawson is here today for follow-up. She is still having issues with frequent Crohn's flares and has been referred to surgery with [facility] for possible colectomy. She has had small amounts of bright red blood in her stool at times. No dark tarry stool. No other blood loss noted.  She has little appetite due to the nausea and abdominal pain that comes with eating. Current weight is 91 lbs.  She is doing her best to stay well hydrated. She has fatigue, dizziness, SOB with exertion and palpitations.  No fever, chills, cough, rash, chest pain or changes in bladder habits.  She recently completed an antibiotic for UTI but is unsure if it has completely resolved. She is monitoring her symptoms.  She takes Pyridium for any pain with urination.  No swelling, numbness or tingling in her extremities.  No falls or syncope reported.    ECOG Performance Status: 1 - Symptomatic but completely ambulatory  Medications:  Allergies as of 01/25/2024       Reactions   Azithromycin Itching, Rash   Darvon [propoxyphene Hcl] Hives   Iohexol Nausea And Vomiting    Desc: VOMITING-10 YRS AGO [facility]   Propoxyphene Hives, Itching   Feraheme  [ferumoxytol ] Other (See Comments)   Severe back spasms   Morphine And Codeine Other (See Comments)   Oseltamivir Rash        Medication List        Accurate as of January 25, 2024  2:00 PM. If you have any questions, ask your nurse or doctor.          cyanocobalamin  1000 MCG/ML injection Commonly known as: VITAMIN B12 Inject 1 mL (1,000 mcg total) into the muscle every 30 (thirty) days. Please include  syringes for administration. Thank you!   cyclobenzaprine  5 MG tablet Commonly known as: FLEXERIL  Take 1 tablet (5 mg total) by mouth 3 (three) times daily as needed for muscle spasms.   dimenhyDRINATE 50 MG tablet Commonly known as: DRAMAMINE Take 50 mg by mouth as needed.   ergocalciferol  1.25 MG (50000 UT) capsule Commonly known as: VITAMIN D2 Take 1 capsule (50,000 Units total) by mouth once a week.   ibuprofen  200 MG tablet Commonly known as: ADVIL  Take 400 mg by mouth every 6 (six) hours as needed for pain.   phenazopyridine 200 MG tablet Commonly known as: PYRIDIUM Take 200 mg by mouth 2 (two) times daily as needed.        Allergies:  Allergies  Allergen Reactions   Azithromycin Itching and Rash   Darvon [Propoxyphene Hcl] Hives   Iohexol Nausea And Vomiting     Desc: VOMITING-10 YRS AGO [facility]   Propoxyphene Hives and Itching   Feraheme  [Ferumoxytol ] Other (See Comments)    Severe back spasms   Morphine And Codeine Other (See Comments)   Oseltamivir Rash    Past Medical History, Surgical history, Social history, and Family History were reviewed and updated.  Review of Systems: All other 10 point review of systems is negative.   Physical Exam:  weight is 91 lb 1.9 oz (41.3 kg). Her  oral temperature is 97.8 F (36.6 C). Her blood pressure is 113/59 (abnormal) and her pulse is 62. Her respiration is 17 and oxygen saturation is 98%.   Wt Readings from Last 3 Encounters:  01/25/24 91 lb 1.9 oz (41.3 kg)  08/18/23 90 lb (40.8 kg)  02/02/23 92 lb (41.7 kg)    Ocular: Sclerae unicteric, pupils equal, round and reactive to light Ear-nose-throat: Oropharynx clear, dentition fair Lymphatic: No cervical or supraclavicular adenopathy Lungs no rales or rhonchi, good excursion bilaterally Heart regular rate and rhythm, no murmur appreciated Abd soft, nontender, positive bowel sounds MSK no focal spinal tenderness, no joint edema Neuro: non-focal,  well-oriented, appropriate affect Breasts: Deferred   Lab Results  Component Value Date   WBC 9.9 01/25/2024   HGB 11.6 (L) 01/25/2024   HCT 36.6 01/25/2024   MCV 93.4 01/25/2024   PLT 346 01/25/2024   Lab Results  Component Value Date   FERRITIN 1,259 (H) 08/18/2023   IRON 40 08/18/2023   TIBC 316 08/18/2023   UIBC 276 08/18/2023   IRONPCTSAT 13 08/18/2023   Lab Results  Component Value Date   RETICCTPCT 1.9 08/18/2023   RBC 3.92 01/25/2024   RETICCTABS 49.8 12/25/2011   No results found for: KPAFRELGTCHN, LAMBDASER, KAPLAMBRATIO No results found for: IGGSERUM, IGA, IGMSERUM No results found for: STEPHANY CARLOTA BENSON MARKEL EARLA JOANNIE DOC Lawson, SPEI   Chemistry      Component Value Date/Time   NA 138 08/18/2023 1329   NA 139 06/14/2017 1022   K 4.0 08/18/2023 1329   K 3.9 06/14/2017 1022   CL 101 08/18/2023 1329   CL 105 09/07/2016 1151   CO2 28 08/18/2023 1329   CO2 26 06/14/2017 1022   BUN 13 08/18/2023 1329   BUN 6.8 (L) 06/14/2017 1022   CREATININE 0.76 08/18/2023 1329   CREATININE 0.8 06/14/2017 1022      Component Value Date/Time   CALCIUM 9.3 08/18/2023 1329   CALCIUM 9.6 06/14/2017 1022   ALKPHOS 123 08/18/2023 1329   ALKPHOS 91 06/14/2017 1022   AST 15 08/18/2023 1329   AST 15 06/14/2017 1022   ALT 15 08/18/2023 1329   ALT 14 06/14/2017 1022   BILITOT 0.2 08/18/2023 1329   BILITOT 0.40 06/14/2017 1022       Impression and Plan: Denise Lawson is a very pleasant 68 yo caucasian female with 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  Iron studies, B 12 and vitamin D  level are pending. We will replace if needed.  Follow-up in 6 months.   Lauraine Pepper, NP 6/24/20252:00 PM

## 2024-01-26 LAB — FERRITIN: Ferritin: 1138 ng/mL — ABNORMAL HIGH (ref 11–307)

## 2024-02-02 ENCOUNTER — Other Ambulatory Visit: Payer: Self-pay | Admitting: Family

## 2024-02-02 ENCOUNTER — Inpatient Hospital Stay: Attending: Hematology & Oncology

## 2024-02-02 VITALS — BP 120/56 | HR 75 | Temp 98.1°F

## 2024-02-02 DIAGNOSIS — K909 Intestinal malabsorption, unspecified: Secondary | ICD-10-CM | POA: Insufficient documentation

## 2024-02-02 DIAGNOSIS — D509 Iron deficiency anemia, unspecified: Secondary | ICD-10-CM

## 2024-02-02 DIAGNOSIS — D500 Iron deficiency anemia secondary to blood loss (chronic): Secondary | ICD-10-CM | POA: Diagnosis present

## 2024-02-02 MED ORDER — SODIUM CHLORIDE 0.9 % IV SOLN
125.0000 mg | Freq: Once | INTRAVENOUS | Status: AC
  Administered 2024-02-02: 125 mg via INTRAVENOUS
  Filled 2024-02-02: qty 125

## 2024-02-02 MED ORDER — SODIUM CHLORIDE 0.9 % IV SOLN: Freq: Once | INTRAVENOUS | Status: AC

## 2024-02-02 NOTE — Patient Instructions (Signed)
Sodium Ferric Gluconate Complex Injection What is this medication? SODIUM FERRIC GLUCONATE COMPLEX (SOE dee um FER ik GLOO koe nate KOM pleks) treats low levels of iron (iron deficiency anemia) in people with kidney disease. Iron is a mineral that plays an important role in making red blood cells, which carry oxygen from your lungs to the rest of your body. This medicine may be used for other purposes; ask your health care provider or pharmacist if you have questions. COMMON BRAND NAME(S): Ferrlecit, Nulecit What should I tell my care team before I take this medication? They need to know if you have any of the following conditions: Anemia that is not from iron deficiency High levels of iron in the blood An unusual or allergic reaction to iron, other medications, foods, dyes, or preservatives Pregnant or are trying to become pregnant Breast-feeding How should I use this medication? This medication is injected into a vein. It is given by your care team in a hospital or clinic setting. Talk to your care team about the use of this medication in children. While it may be prescribed for children as young as 6 years for selected conditions, precautions do apply. Overdosage: If you think you have taken too much of this medicine contact a poison control center or emergency room at once. NOTE: This medicine is only for you. Do not share this medicine with others. What if I miss a dose? It is important not to miss your dose. Call your care team if you are unable to keep an appointment. What may interact with this medication? Do not take this medication with any of the following: Deferasirox Deferoxamine Dimercaprol This medication may also interact with the following: Other iron products This list may not describe all possible interactions. Give your health care provider a list of all the medicines, herbs, non-prescription drugs, or dietary supplements you use. Also tell them if you smoke, drink  alcohol, or use illegal drugs. Some items may interact with your medicine. What should I watch for while using this medication? Your condition will be monitored carefully while you are receiving this medication. Visit your care team for regular checks on your progress. You may need blood work while you are taking this medication. What side effects may I notice from receiving this medication? Side effects that you should report to your care team as soon as possible: Allergic reactions--skin rash, itching, hives, swelling of the face, lips, tongue, or throat Low blood pressure--dizziness, feeling faint or lightheaded, blurry vision Shortness of breath Side effects that usually do not require medical attention (report to your care team if they continue or are bothersome): Flushing Headache Joint pain Muscle pain Nausea Pain, redness, or irritation at injection site This list may not describe all possible side effects. Call your doctor for medical advice about side effects. You may report side effects to FDA at 1-800-FDA-1088. Where should I keep my medication? This medication is given in a hospital or clinic and will not be stored at home. NOTE: This sheet is a summary. It may not cover all possible information. If you have questions about this medicine, talk to your doctor, pharmacist, or health care provider.  2024 Elsevier/Gold Standard (2020-12-13 00:00:00)

## 2024-02-09 ENCOUNTER — Inpatient Hospital Stay

## 2024-02-09 VITALS — BP 108/56 | HR 82 | Temp 98.0°F | Resp 18

## 2024-02-09 DIAGNOSIS — D509 Iron deficiency anemia, unspecified: Secondary | ICD-10-CM

## 2024-02-09 DIAGNOSIS — D500 Iron deficiency anemia secondary to blood loss (chronic): Secondary | ICD-10-CM | POA: Diagnosis not present

## 2024-02-09 DIAGNOSIS — K909 Intestinal malabsorption, unspecified: Secondary | ICD-10-CM

## 2024-02-09 MED ORDER — SODIUM CHLORIDE 0.9 % IV SOLN
Freq: Once | INTRAVENOUS | Status: AC
Start: 2024-02-09 — End: 2024-02-09

## 2024-02-09 MED ORDER — SODIUM CHLORIDE 0.9 % IV SOLN
125.0000 mg | Freq: Once | INTRAVENOUS | Status: AC
  Administered 2024-02-09: 125 mg via INTRAVENOUS
  Filled 2024-02-09: qty 10

## 2024-02-09 NOTE — Patient Instructions (Signed)
Sodium Ferric Gluconate Complex Injection What is this medication? SODIUM FERRIC GLUCONATE COMPLEX (SOE dee um FER ik GLOO koe nate KOM pleks) treats low levels of iron (iron deficiency anemia) in people with kidney disease. Iron is a mineral that plays an important role in making red blood cells, which carry oxygen from your lungs to the rest of your body. This medicine may be used for other purposes; ask your health care provider or pharmacist if you have questions. COMMON BRAND NAME(S): Ferrlecit, Nulecit What should I tell my care team before I take this medication? They need to know if you have any of the following conditions: Anemia that is not from iron deficiency High levels of iron in the blood An unusual or allergic reaction to iron, other medications, foods, dyes, or preservatives Pregnant or are trying to become pregnant Breast-feeding How should I use this medication? This medication is injected into a vein. It is given by your care team in a hospital or clinic setting. Talk to your care team about the use of this medication in children. While it may be prescribed for children as young as 6 years for selected conditions, precautions do apply. Overdosage: If you think you have taken too much of this medicine contact a poison control center or emergency room at once. NOTE: This medicine is only for you. Do not share this medicine with others. What if I miss a dose? It is important not to miss your dose. Call your care team if you are unable to keep an appointment. What may interact with this medication? Do not take this medication with any of the following: Deferasirox Deferoxamine Dimercaprol This medication may also interact with the following: Other iron products This list may not describe all possible interactions. Give your health care provider a list of all the medicines, herbs, non-prescription drugs, or dietary supplements you use. Also tell them if you smoke, drink  alcohol, or use illegal drugs. Some items may interact with your medicine. What should I watch for while using this medication? Your condition will be monitored carefully while you are receiving this medication. Visit your care team for regular checks on your progress. You may need blood work while you are taking this medication. What side effects may I notice from receiving this medication? Side effects that you should report to your care team as soon as possible: Allergic reactions--skin rash, itching, hives, swelling of the face, lips, tongue, or throat Low blood pressure--dizziness, feeling faint or lightheaded, blurry vision Shortness of breath Side effects that usually do not require medical attention (report to your care team if they continue or are bothersome): Flushing Headache Joint pain Muscle pain Nausea Pain, redness, or irritation at injection site This list may not describe all possible side effects. Call your doctor for medical advice about side effects. You may report side effects to FDA at 1-800-FDA-1088. Where should I keep my medication? This medication is given in a hospital or clinic and will not be stored at home. NOTE: This sheet is a summary. It may not cover all possible information. If you have questions about this medicine, talk to your doctor, pharmacist, or health care provider.  2024 Elsevier/Gold Standard (2020-12-13 00:00:00)

## 2024-02-16 ENCOUNTER — Inpatient Hospital Stay: Payer: Medicare Other

## 2024-02-16 ENCOUNTER — Ambulatory Visit: Payer: Medicare Other | Admitting: Family

## 2024-04-14 ENCOUNTER — Other Ambulatory Visit (HOSPITAL_BASED_OUTPATIENT_CLINIC_OR_DEPARTMENT_OTHER): Payer: Self-pay | Admitting: Surgical Oncology

## 2024-04-14 DIAGNOSIS — K50819 Crohn's disease of both small and large intestine with unspecified complications: Secondary | ICD-10-CM

## 2024-05-02 ENCOUNTER — Ambulatory Visit (HOSPITAL_BASED_OUTPATIENT_CLINIC_OR_DEPARTMENT_OTHER)
Admission: RE | Admit: 2024-05-02 | Discharge: 2024-05-02 | Disposition: A | Discharge status: Home / Self Care | Source: Ambulatory Visit | Attending: Surgical Oncology | Admitting: Surgical Oncology

## 2024-05-02 DIAGNOSIS — K50819 Crohn's disease of both small and large intestine with unspecified complications: Secondary | ICD-10-CM | POA: Insufficient documentation

## 2024-05-02 MED ORDER — IOHEXOL 300 MG/ML  SOLN
100.0000 mL | Freq: Once | INTRAMUSCULAR | Status: AC | PRN
Start: 2024-05-02 — End: 2024-05-02
  Administered 2024-05-02: 100 mL via INTRAVENOUS

## 2024-07-18 ENCOUNTER — Inpatient Hospital Stay: Admitting: Family

## 2024-07-18 ENCOUNTER — Other Ambulatory Visit: Payer: Self-pay

## 2024-07-18 ENCOUNTER — Inpatient Hospital Stay: Attending: Family

## 2024-07-18 VITALS — BP 142/72 | HR 71 | Temp 98.0°F | Wt 95.8 lb

## 2024-07-18 DIAGNOSIS — D509 Iron deficiency anemia, unspecified: Secondary | ICD-10-CM

## 2024-07-18 DIAGNOSIS — K5090 Crohn's disease, unspecified, without complications: Secondary | ICD-10-CM | POA: Insufficient documentation

## 2024-07-18 DIAGNOSIS — D510 Vitamin B12 deficiency anemia due to intrinsic factor deficiency: Secondary | ICD-10-CM

## 2024-07-18 DIAGNOSIS — K909 Intestinal malabsorption, unspecified: Secondary | ICD-10-CM

## 2024-07-18 DIAGNOSIS — E559 Vitamin D deficiency, unspecified: Secondary | ICD-10-CM | POA: Diagnosis not present

## 2024-07-18 DIAGNOSIS — K50113 Crohn's disease of large intestine with fistula: Secondary | ICD-10-CM

## 2024-07-18 LAB — CMP (CANCER CENTER ONLY)
ALT: 18 U/L (ref 0–44)
AST: 21 U/L (ref 15–41)
Albumin: 3.8 g/dL (ref 3.5–5.0)
Alkaline Phosphatase: 132 U/L — ABNORMAL HIGH (ref 38–126)
Anion gap: 11 (ref 5–15)
BUN: 11 mg/dL (ref 8–23)
CO2: 26 mmol/L (ref 22–32)
Calcium: 9.9 mg/dL (ref 8.9–10.3)
Chloride: 106 mmol/L (ref 98–111)
Creatinine: 0.71 mg/dL (ref 0.44–1.00)
GFR, Estimated: >60 mL/min (ref >60)
Glucose, Bld: 98 mg/dL (ref 70–99)
Potassium: 4.2 mmol/L (ref 3.5–5.1)
Sodium: 143 mmol/L (ref 135–145)
Total Bilirubin: 0.2 mg/dL (ref 0.0–1.2)
Total Protein: 7.1 g/dL (ref 6.5–8.1)

## 2024-07-18 LAB — IRON AND IRON BINDING CAPACITY (CC-WL,HP ONLY)
Iron: 38 ug/dL (ref 28–170)
Saturation Ratios: 13 % (ref 10.4–31.8)
TIBC: 291 ug/dL (ref 250–450)
UIBC: 254 ug/dL

## 2024-07-18 LAB — CBC WITH DIFFERENTIAL (CANCER CENTER ONLY)
Abs Immature Granulocytes: 0.05 K/uL (ref 0.00–0.07)
Basophils Absolute: 0 K/uL (ref 0.0–0.1)
Basophils Relative: 0 %
Eosinophils Absolute: 0.2 K/uL (ref 0.0–0.5)
Eosinophils Relative: 2 %
HCT: 38 % (ref 36.0–46.0)
Hemoglobin: 11.8 g/dL — ABNORMAL LOW (ref 12.0–15.0)
Immature Granulocytes: 0 %
Lymphocytes Relative: 22 %
Lymphs Abs: 2.5 K/uL (ref 0.7–4.0)
MCH: 30.1 pg (ref 26.0–34.0)
MCHC: 31.1 g/dL (ref 30.0–36.0)
MCV: 96.9 fL (ref 80.0–100.0)
Monocytes Absolute: 0.7 K/uL (ref 0.1–1.0)
Monocytes Relative: 6 %
Neutro Abs: 7.9 K/uL — ABNORMAL HIGH (ref 1.7–7.7)
Neutrophils Relative %: 70 %
Platelet Count: 272 K/uL (ref 150–400)
RBC: 3.92 MIL/uL (ref 3.87–5.11)
RDW: 14.3 % (ref 11.5–15.5)
WBC Count: 11.3 K/uL — ABNORMAL HIGH (ref 4.0–10.5)
nRBC: 0 % (ref 0.0–0.2)

## 2024-07-18 LAB — VITAMIN B12: Vitamin B-12: 295 pg/mL (ref 180–914)

## 2024-07-18 LAB — RETICULOCYTES
Immature Retic Fract: 6.3 % (ref 2.3–15.9)
RBC.: 3.94 MIL/uL (ref 3.87–5.11)
Retic Count, Absolute: 50.4 K/uL (ref 19.0–186.0)
Retic Ct Pct: 1.3 % (ref 0.4–3.1)

## 2024-07-18 LAB — FERRITIN: Ferritin: 1318 ng/mL — ABNORMAL HIGH (ref 11–307)

## 2024-07-18 LAB — VITAMIN D 25 HYDROXY (VIT D DEFICIENCY, FRACTURES): Vit D, 25-Hydroxy: 17 ng/mL — ABNORMAL LOW (ref 30–100)

## 2024-07-18 MED ORDER — CYANOCOBALAMIN 1000 MCG/ML IJ SOLN: 1000.0000 ug | INTRAMUSCULAR | 3 refills | Status: AC

## 2024-07-19 ENCOUNTER — Encounter: Payer: Self-pay | Admitting: Family

## 2024-07-19 NOTE — Progress Notes (Signed)
 Hematology and Oncology Follow Up Visit  GIANI WINTHER [id number] 1956-04-02 68 y.o. 07/19/2024   Principle Diagnosis:  Recurrent iron deficiency anemia Pernicious anemia Crohn's disease - exacerbation   Current Therapy:        IV iron as indicated  B12 1,000 mcg IM monthly - self  Vitamin D  50,000 units PO twice a week   Interim History:  Ms. Dever is here today for follow-up. 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  She has nausea at times, mild SOB with exertion, occasional palpitations.  She has seen Dr. 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 Right now they are focusing on improving her oral intake and hydration prior to bowel surgery.  Weight is 95 lbs.  She is doing B 12 injections at home once a week. B 12 295.  Vitamin is 17. She takes once a week D2 an has had added a daily supplement as well to help increase.  Iron saturation is low end of normal at 13%, Ferritin elevated at 1, 318 - reactive.  She has some nausea at times. Abdominal pain waxes and wanes.  No fever, chills, cough, rash, dizziness, chest pain, palpitations or changes in bladder habits at this time.  No falls or syncope reported.   ECOG Performance Status: 1 - Symptomatic but completely ambulatory  Medications:  Allergies as of 07/18/2024       Reactions   Azithromycin Itching, Rash   Darvon [propoxyphene Hcl] Hives   Doxycycline Hyclate Diarrhea, Other (See Comments), Nausea Only   Iohexol  Nausea And Vomiting    Desc: [facility]-10 YRS [facility]GO [facility]   Propoxyphene Hives, Itching   Feraheme  [ferumoxytol ] Other (See Comments)   Severe back spasms   Morphine And Codeine Other (See Comments)   Oseltamivir Rash   Iodinated Contrast Media Nausea And Vomiting   Iodinated contrast media (substance)   Prednisone Hives, Rash   Sulfa Antibiotics Rash   Substance with sulfonamide structure and  antibacterial mechanism of action (substance)        Medication List        Accurate as of July 18, 2024 11:59 PM. If you have any questions, ask your nurse or doctor.          cyanocobalamin  1000 MCG/ML injection Commonly known as: VITAMIN B12 Inject 1 mL (1,000 mcg total) into the muscle every 30 (thirty) days. Please include syringes for administration. Thank you!   cyclobenzaprine  5 MG tablet Commonly known as: FLEXERIL  Take 1 tablet (5 mg total) by mouth 3 (three) times daily as needed for muscle spasms.   dimenhyDRINATE 50 MG tablet Commonly known as: DRAMAMINE Take 50 mg by mouth as needed.   ergocalciferol  1.25 MG (50000 UT) capsule Commonly known as: VITAMIN D2 Take 1 capsule (50,000 Units total) by mouth once a week.   hyoscyamine 0.125 MG tablet Commonly known as: LEVSIN Take 0.125 mg by mouth every 6 (six) hours as needed.   ibuprofen  200 MG tablet Commonly known as: ADVIL  Take 400 mg by mouth every 6 (six) hours as needed for pain.   ondansetron  4 MG tablet Commonly known as: ZOFRAN  Take 4 mg by mouth every 8 (eight) hours as needed.   phenazopyridine 200 MG tablet Commonly known as: PYRIDIUM Take 200 mg by mouth 2 (two) times daily as needed.        Allergies: Allergies[1]  Past Medical History, Surgical history,  Social history, and Family History were reviewed and updated.  Review of Systems: All other 10 point review of systems is negative.   Physical Exam:  weight is 95 lb 12.8 oz (43.5 kg). Her oral temperature is 98 F (36.7 C). Her blood pressure is 142/72 (abnormal) and her pulse is 71. Her oxygen saturation is 100%.   Wt Readings from Last 3 Encounters:  07/18/24 95 lb 12.8 oz (43.5 kg)  01/25/24 91 lb 1.9 oz (41.3 kg)  08/18/23 90 lb (40.8 kg)    Ocular: Sclerae unicteric, pupils equal, round and reactive to light Ear-nose-throat: Oropharynx clear, dentition fair Lymphatic: No cervical or supraclavicular  adenopathy Lungs no rales or rhonchi, good excursion bilaterally Heart regular rate and rhythm, no murmur appreciated Abd soft, nontender, positive bowel sounds MSK no focal spinal tenderness, no joint edema Neuro: non-focal, well-oriented, appropriate affect Breasts: Deferred   Lab Results  Component Value Date   WBC 11.3 (H) 07/18/2024   HGB 11.8 (L) 07/18/2024   HCT 38.0 07/18/2024   MCV 96.9 07/18/2024   PLT 272 07/18/2024   Lab Results  Component Value Date   FERRITIN 1,318 (H) 07/18/2024   IRON 38 07/18/2024   TIBC 291 07/18/2024   UIBC 254 07/18/2024   IRONPCTSAT 13 07/18/2024   Lab Results  Component Value Date   RETICCTPCT 1.3 07/18/2024   RBC 3.92 07/18/2024   RBC 3.94 07/18/2024   RETICCTABS 49.8 12/25/2011   No results found for: KPAFRELGTCHN, LAMBDASER, KAPLAMBRATIO No results found for: IGGSERUM, IGA, IGMSERUM No results found for: STEPHANY CARLOTA BENSON MARKEL EARLA JOANNIE DOC VICK, SPEI   Chemistry      Component Value Date/Time   NA 143 07/18/2024 1408   NA 139 06/14/2017 1022   K 4.2 07/18/2024 1408   K 3.9 06/14/2017 1022   CL 106 07/18/2024 1408   CL 105 09/07/2016 1151   CO2 26 07/18/2024 1408   CO2 26 06/14/2017 1022   BUN 11 07/18/2024 1408   BUN 6.8 (L) 06/14/2017 1022   CREATININE 0.71 07/18/2024 1408   CREATININE 0.8 06/14/2017 1022      Component Value Date/Time   CALCIUM 9.9 07/18/2024 1408   CALCIUM 9.6 06/14/2017 1022   ALKPHOS 132 (H) 07/18/2024 1408   ALKPHOS 91 06/14/2017 1022   AST 21 07/18/2024 1408   AST 15 06/14/2017 1022   ALT 18 07/18/2024 1408   ALT 14 06/14/2017 1022   BILITOT 0.2 07/18/2024 1408   BILITOT 0.40 06/14/2017 1022       Impression and Plan: Ms. 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  Iron saturation is is 13%. We will get her set up for 2 doses of IV iron.  Follow-up  in 5 months.   Lauraine Pepper, NP 12/17/202512:44 PM     [1]  Allergies Allergen Reactions   Azithromycin Itching and Rash   Darvon [Propoxyphene Hcl] Hives   Doxycycline Hyclate Diarrhea, Other (See Comments) and Nausea Only   Iohexol  Nausea And Vomiting     Desc: [facility]OM[facility]ING-10 YRS [facility]GO [facility]   Propoxyphene Hives and Itching   Feraheme  [Ferumoxytol ] Other (See Comments)    Severe back spasms   Morphine And Codeine Other (See Comments)   Oseltamivir Rash   Iodinated Contrast Media Nausea And Vomiting    Iodinated contrast media (substance)   Prednisone Hives and Rash   Sulfa Antibiotics Rash    Substance  with sulfonamide structure and antibacterial mechanism of action (substance)

## 2024-08-14 ENCOUNTER — Inpatient Hospital Stay: Attending: Family

## 2024-08-14 VITALS — BP 130/63 | HR 74 | Temp 98.2°F | Resp 18 | Wt 94.0 lb

## 2024-08-14 DIAGNOSIS — K909 Intestinal malabsorption, unspecified: Secondary | ICD-10-CM

## 2024-08-14 DIAGNOSIS — D509 Iron deficiency anemia, unspecified: Secondary | ICD-10-CM

## 2024-08-14 MED ORDER — SODIUM CHLORIDE 0.9 % IV SOLN: Freq: Once | INTRAVENOUS | Status: AC

## 2024-08-14 MED ORDER — SODIUM CHLORIDE 0.9 % IV SOLN
125.0000 mg | Freq: Once | INTRAVENOUS | Status: AC
  Administered 2024-08-14: 125 mg via INTRAVENOUS
  Filled 2024-08-14: qty 10

## 2024-08-14 NOTE — Patient Instructions (Signed)
 [facility] - A DEPT OF [facility]  Discharge Instructions: Thank you for choosing [facility] to provide your oncology and hematology care.   If you have a lab appointment with the Cancer Center, please go directly to the Cancer Center and check in at the registration area.  Wear comfortable clothing and clothing appropriate for easy access to any Portacath or PICC line.   We strive to give you quality time with your provider. You may need to reschedule your appointment if you arrive late (15 or more minutes).  Arriving late affects you and other patients whose appointments are after yours.  Also, if you miss three or more appointments without notifying the office, you may be dismissed from the clinic at the provider's discretion.      For prescription refill requests, have your pharmacy contact our office and allow 72 hours for refills to be completed.    Today you received the following agents Ferrlecit      To help prevent nausea and vomiting after your treatment, we encourage you to take your nausea medication as directed.  BELOW ARE SYMPTOMS THAT SHOULD BE REPORTED IMMEDIATELY: *FEVER GREATER THAN 100.4 F (38 C) OR HIGHER *CHILLS OR SWEATING *NAUSEA AND VOMITING THAT IS NOT CONTROLLED WITH YOUR NAUSEA MEDICATION *UNUSUAL SHORTNESS OF BREATH *UNUSUAL BRUISING OR BLEEDING *URINARY PROBLEMS (pain or burning when urinating, or frequent urination) *BOWEL PROBLEMS (unusual diarrhea, constipation, pain near the anus) TENDERNESS IN MOUTH AND THROAT WITH OR WITHOUT PRESENCE OF ULCERS (sore throat, sores in mouth, or a toothache) UNUSUAL RASH, SWELLING OR PAIN  UNUSUAL VAGINAL DISCHARGE OR ITCHING   Items with * indicate a potential emergency and should be followed up as soon as possible or go to the Emergency Department if any problems should occur.  Please show the CHEMOTHERAPY ALERT CARD or IMMUNOTHERAPY ALERT CARD at check-in to the  Emergency Department and triage nurse. Should you have questions after your visit or need to cancel or reschedule your appointment, please contact [facility] - A DEPT OF [facility]  [phone] and follow the prompts.  Office hours are 8:00 a.m. to 4:30 p.m. Monday - Friday. Please note that voicemails left after 4:00 p.m. may not be returned until the following business day.  We are closed weekends and major holidays. You have access to a nurse at all times for urgent questions. Please call the main number to the clinic [phone] and follow the prompts.  For any non-urgent questions, you may also contact your provider using MyChart. We now offer e-Visits for anyone 39 and older to request care online for non-urgent symptoms. For details visit [email or web address].   Also download the MyChart app! Go to the app store, search "MyChart", open the app, select [place], and log in with your MyChart username and password.

## 2024-12-19 ENCOUNTER — Inpatient Hospital Stay: Admitting: Family

## 2024-12-19 ENCOUNTER — Inpatient Hospital Stay
